# Patient Record
Sex: Female | Born: 1955 | Race: Black or African American | Hispanic: No | Smoking: Never smoker
Health system: Southern US, Community
[De-identification: ages and names within clinical notes are randomized; demographics above are authoritative.]

## PROBLEM LIST (undated history)

## (undated) DIAGNOSIS — E7800 Pure hypercholesterolemia, unspecified: Secondary | ICD-10-CM

## (undated) DIAGNOSIS — I639 Cerebral infarction, unspecified: Secondary | ICD-10-CM

## (undated) DIAGNOSIS — I10 Essential (primary) hypertension: Secondary | ICD-10-CM

---

## 2000-07-06 DIAGNOSIS — E119 Type 2 diabetes mellitus without complications: Secondary | ICD-10-CM | POA: Insufficient documentation

## 2003-03-20 ENCOUNTER — Encounter: Admission: RE | Admit: 2003-03-20 | Discharge: 2003-06-18 | Payer: Self-pay | Admitting: Family Medicine

## 2004-04-07 DIAGNOSIS — R809 Proteinuria, unspecified: Secondary | ICD-10-CM | POA: Insufficient documentation

## 2007-01-04 ENCOUNTER — Inpatient Hospital Stay (HOSPITAL_COMMUNITY): Admission: EM | Admit: 2007-01-04 | Discharge: 2007-01-10 | Payer: Self-pay | Admitting: Emergency Medicine

## 2007-01-04 DIAGNOSIS — I69959 Hemiplegia and hemiparesis following unspecified cerebrovascular disease affecting unspecified side: Secondary | ICD-10-CM | POA: Insufficient documentation

## 2007-01-05 ENCOUNTER — Encounter (INDEPENDENT_AMBULATORY_CARE_PROVIDER_SITE_OTHER): Payer: Self-pay | Admitting: Internal Medicine

## 2007-01-05 ENCOUNTER — Ambulatory Visit: Payer: Self-pay | Admitting: Vascular Surgery

## 2007-01-13 ENCOUNTER — Encounter: Admission: RE | Admit: 2007-01-13 | Discharge: 2007-04-13 | Payer: Self-pay | Admitting: Family Medicine

## 2007-01-14 ENCOUNTER — Inpatient Hospital Stay (HOSPITAL_COMMUNITY): Admission: EM | Admit: 2007-01-14 | Discharge: 2007-01-18 | Payer: Self-pay | Admitting: Emergency Medicine

## 2007-01-16 ENCOUNTER — Ambulatory Visit: Payer: Self-pay | Admitting: Physical Medicine & Rehabilitation

## 2007-01-18 ENCOUNTER — Ambulatory Visit: Payer: Self-pay | Admitting: Physical Medicine & Rehabilitation

## 2007-01-18 ENCOUNTER — Inpatient Hospital Stay (HOSPITAL_COMMUNITY)
Admission: RE | Admit: 2007-01-18 | Discharge: 2007-01-25 | Payer: Self-pay | Admitting: Physical Medicine & Rehabilitation

## 2007-01-27 ENCOUNTER — Encounter
Admission: RE | Admit: 2007-01-27 | Discharge: 2007-04-27 | Payer: Self-pay | Admitting: Physical Medicine & Rehabilitation

## 2007-03-03 ENCOUNTER — Ambulatory Visit: Payer: Self-pay | Admitting: Physical Medicine & Rehabilitation

## 2007-03-03 ENCOUNTER — Encounter
Admission: RE | Admit: 2007-03-03 | Discharge: 2007-06-01 | Payer: Self-pay | Admitting: Physical Medicine & Rehabilitation

## 2007-05-10 ENCOUNTER — Encounter
Admission: RE | Admit: 2007-05-10 | Discharge: 2007-06-29 | Payer: Self-pay | Admitting: Physical Medicine & Rehabilitation

## 2007-05-26 ENCOUNTER — Ambulatory Visit: Payer: Self-pay | Admitting: Physical Medicine & Rehabilitation

## 2007-07-05 ENCOUNTER — Encounter
Admission: RE | Admit: 2007-07-05 | Discharge: 2007-09-15 | Payer: Self-pay | Admitting: Physical Medicine & Rehabilitation

## 2007-07-05 ENCOUNTER — Ambulatory Visit: Payer: Self-pay | Admitting: Physical Medicine & Rehabilitation

## 2007-09-28 ENCOUNTER — Ambulatory Visit: Payer: Self-pay | Admitting: Physical Medicine & Rehabilitation

## 2007-09-28 ENCOUNTER — Encounter
Admission: RE | Admit: 2007-09-28 | Discharge: 2007-09-30 | Payer: Self-pay | Admitting: Physical Medicine & Rehabilitation

## 2008-03-26 ENCOUNTER — Encounter
Admission: RE | Admit: 2008-03-26 | Discharge: 2008-03-26 | Payer: Self-pay | Admitting: Physical Medicine & Rehabilitation

## 2008-05-24 ENCOUNTER — Ambulatory Visit: Payer: Self-pay | Admitting: Nurse Practitioner

## 2008-05-24 DIAGNOSIS — E7800 Pure hypercholesterolemia, unspecified: Secondary | ICD-10-CM | POA: Insufficient documentation

## 2008-05-24 DIAGNOSIS — I10 Essential (primary) hypertension: Secondary | ICD-10-CM | POA: Insufficient documentation

## 2008-05-24 LAB — CONVERTED CEMR LAB: Hgb A1c MFr Bld: 6.9 %

## 2008-05-28 LAB — CONVERTED CEMR LAB
AST: 19 units/L (ref 0–37)
Alkaline Phosphatase: 61 units/L (ref 39–117)
BUN: 16 mg/dL (ref 6–23)
CO2: 24 meq/L (ref 19–32)
Chloride: 106 meq/L (ref 96–112)
Cholesterol: 229 mg/dL — ABNORMAL HIGH (ref 0–200)
Creatinine, Ser: 0.89 mg/dL (ref 0.40–1.20)
Eosinophils Absolute: 0.2 10*3/uL (ref 0.0–0.7)
Glucose, Bld: 96 mg/dL (ref 70–99)
HCT: 37 % (ref 36.0–46.0)
Lymphocytes Relative: 40 % (ref 12–46)
Lymphs Abs: 2.5 10*3/uL (ref 0.7–4.0)
MCHC: 30.5 g/dL (ref 30.0–36.0)
Neutrophils Relative %: 51 % (ref 43–77)
Platelets: 277 10*3/uL (ref 150–400)
Potassium: 3.8 meq/L (ref 3.5–5.3)
RDW: 16 % — ABNORMAL HIGH (ref 11.5–15.5)
Total Bilirubin: 0.3 mg/dL (ref 0.3–1.2)
WBC: 6.2 10*3/uL (ref 4.0–10.5)

## 2008-05-29 ENCOUNTER — Ambulatory Visit: Payer: Self-pay | Admitting: *Deleted

## 2008-06-08 ENCOUNTER — Ambulatory Visit: Payer: Self-pay | Admitting: Nurse Practitioner

## 2008-07-03 ENCOUNTER — Encounter (INDEPENDENT_AMBULATORY_CARE_PROVIDER_SITE_OTHER): Payer: Self-pay | Admitting: Nurse Practitioner

## 2008-07-09 ENCOUNTER — Ambulatory Visit: Payer: Self-pay | Admitting: Nurse Practitioner

## 2008-07-09 LAB — CONVERTED CEMR LAB
HDL goal, serum: 40 mg/dL
LDL Goal: 70 mg/dL

## 2008-07-11 ENCOUNTER — Ambulatory Visit: Payer: Self-pay | Admitting: Internal Medicine

## 2008-07-13 ENCOUNTER — Encounter (INDEPENDENT_AMBULATORY_CARE_PROVIDER_SITE_OTHER): Payer: Self-pay | Admitting: Nurse Practitioner

## 2008-07-17 ENCOUNTER — Encounter (INDEPENDENT_AMBULATORY_CARE_PROVIDER_SITE_OTHER): Payer: Self-pay | Admitting: Nurse Practitioner

## 2008-07-20 ENCOUNTER — Encounter (INDEPENDENT_AMBULATORY_CARE_PROVIDER_SITE_OTHER): Payer: Self-pay | Admitting: Nurse Practitioner

## 2008-07-24 ENCOUNTER — Encounter (INDEPENDENT_AMBULATORY_CARE_PROVIDER_SITE_OTHER): Payer: Self-pay | Admitting: Nurse Practitioner

## 2008-07-24 DIAGNOSIS — D649 Anemia, unspecified: Secondary | ICD-10-CM | POA: Insufficient documentation

## 2008-08-20 ENCOUNTER — Telehealth (INDEPENDENT_AMBULATORY_CARE_PROVIDER_SITE_OTHER): Payer: Self-pay | Admitting: Nurse Practitioner

## 2008-09-10 ENCOUNTER — Encounter (INDEPENDENT_AMBULATORY_CARE_PROVIDER_SITE_OTHER): Payer: Self-pay | Admitting: Nurse Practitioner

## 2008-09-10 ENCOUNTER — Ambulatory Visit: Payer: Self-pay | Admitting: Nurse Practitioner

## 2008-09-10 DIAGNOSIS — H409 Unspecified glaucoma: Secondary | ICD-10-CM | POA: Insufficient documentation

## 2008-09-11 ENCOUNTER — Encounter (INDEPENDENT_AMBULATORY_CARE_PROVIDER_SITE_OTHER): Payer: Self-pay | Admitting: Nurse Practitioner

## 2008-09-14 ENCOUNTER — Ambulatory Visit (HOSPITAL_COMMUNITY): Admission: RE | Admit: 2008-09-14 | Discharge: 2008-09-14 | Payer: Self-pay | Admitting: Family Medicine

## 2008-09-14 DIAGNOSIS — A599 Trichomoniasis, unspecified: Secondary | ICD-10-CM | POA: Insufficient documentation

## 2008-12-12 ENCOUNTER — Ambulatory Visit: Payer: Self-pay | Admitting: Nurse Practitioner

## 2008-12-12 LAB — CONVERTED CEMR LAB
AST: 14 units/L (ref 0–37)
Cholesterol: 152 mg/dL (ref 0–200)
Total CHOL/HDL Ratio: 4.2
Triglycerides: 146 mg/dL (ref <150)
VLDL: 29 mg/dL (ref 0–40)

## 2008-12-13 ENCOUNTER — Encounter (INDEPENDENT_AMBULATORY_CARE_PROVIDER_SITE_OTHER): Payer: Self-pay | Admitting: Nurse Practitioner

## 2008-12-17 ENCOUNTER — Encounter (INDEPENDENT_AMBULATORY_CARE_PROVIDER_SITE_OTHER): Payer: Self-pay | Admitting: Nurse Practitioner

## 2009-01-15 ENCOUNTER — Ambulatory Visit: Payer: Self-pay | Admitting: Nurse Practitioner

## 2009-03-14 ENCOUNTER — Ambulatory Visit: Payer: Self-pay | Admitting: Nurse Practitioner

## 2009-04-25 ENCOUNTER — Ambulatory Visit: Payer: Self-pay | Admitting: Nurse Practitioner

## 2009-04-25 DIAGNOSIS — E669 Obesity, unspecified: Secondary | ICD-10-CM | POA: Insufficient documentation

## 2009-04-25 LAB — CONVERTED CEMR LAB
Bilirubin Urine: NEGATIVE
Blood Glucose, Fingerstick: 103
Microalb, Ur: 1.2 mg/dL (ref 0.00–1.89)
Specific Gravity, Urine: 1.025
Urobilinogen, UA: 0.2

## 2009-05-06 ENCOUNTER — Encounter (INDEPENDENT_AMBULATORY_CARE_PROVIDER_SITE_OTHER): Payer: Self-pay | Admitting: Nurse Practitioner

## 2009-06-25 ENCOUNTER — Ambulatory Visit: Payer: Self-pay | Admitting: Nurse Practitioner

## 2009-06-25 LAB — CONVERTED CEMR LAB
Albumin: 4.1 g/dL (ref 3.5–5.2)
Hgb A1c MFr Bld: 6.7 %
Indirect Bilirubin: 0.2 mg/dL (ref 0.0–0.9)
Triglycerides: 151 mg/dL — ABNORMAL HIGH (ref <150)

## 2009-06-27 ENCOUNTER — Encounter (INDEPENDENT_AMBULATORY_CARE_PROVIDER_SITE_OTHER): Payer: Self-pay | Admitting: Nurse Practitioner

## 2009-09-10 ENCOUNTER — Ambulatory Visit: Payer: Self-pay | Admitting: Nurse Practitioner

## 2009-09-18 ENCOUNTER — Ambulatory Visit (HOSPITAL_COMMUNITY): Admission: RE | Admit: 2009-09-18 | Discharge: 2009-09-18 | Payer: Self-pay | Admitting: Internal Medicine

## 2009-09-26 ENCOUNTER — Encounter (INDEPENDENT_AMBULATORY_CARE_PROVIDER_SITE_OTHER): Payer: Self-pay | Admitting: Nurse Practitioner

## 2010-01-10 ENCOUNTER — Ambulatory Visit: Payer: Self-pay | Admitting: Nurse Practitioner

## 2010-01-10 DIAGNOSIS — R252 Cramp and spasm: Secondary | ICD-10-CM | POA: Insufficient documentation

## 2010-01-10 LAB — CONVERTED CEMR LAB: Blood Glucose, Fingerstick: 106

## 2010-01-13 LAB — CONVERTED CEMR LAB: Hgb A1c MFr Bld: 7.5 % — ABNORMAL HIGH (ref <5.7)

## 2010-05-13 ENCOUNTER — Ambulatory Visit: Payer: Self-pay | Admitting: Nurse Practitioner

## 2010-05-13 LAB — CONVERTED CEMR LAB: Hgb A1c MFr Bld: 7.2 %

## 2010-08-03 LAB — CONVERTED CEMR LAB
Alkaline Phosphatase: 69 units/L (ref 39–117)
BUN: 24 mg/dL — ABNORMAL HIGH (ref 6–23)
Bilirubin Urine: NEGATIVE
Bilirubin Urine: NEGATIVE
Blood in Urine, dipstick: NEGATIVE
CO2: 25 meq/L (ref 19–32)
Calcium: 9.4 mg/dL (ref 8.4–10.5)
Chlamydia, DNA Probe: NEGATIVE
Chloride: 103 meq/L (ref 96–112)
Creatinine, Ser: 1.02 mg/dL (ref 0.40–1.20)
Eosinophils Relative: 1 % (ref 0–5)
Glucose, Bld: 73 mg/dL (ref 70–99)
Glucose, Urine, Semiquant: NEGATIVE
HDL: 36 mg/dL — ABNORMAL LOW (ref >39)
Hgb A1c MFr Bld: 6.5 %
KOH Prep: NEGATIVE
KOH Prep: NEGATIVE
Ketones, urine, test strip: NEGATIVE
Ketones, urine, test strip: NEGATIVE
LDL Cholesterol: 112 mg/dL — ABNORMAL HIGH (ref 0–99)
Lymphocytes Relative: 34 % (ref 12–46)
Lymphs Abs: 2.4 10*3/uL (ref 0.7–4.0)
Neutrophils Relative %: 59 % (ref 43–77)
Nitrite: NEGATIVE
OCCULT 1: NEGATIVE
Pap Smear: NEGATIVE
Platelets: 284 10*3/uL (ref 150–400)
Potassium: 4 meq/L (ref 3.5–5.3)
Potassium: 4 meq/L (ref 3.5–5.3)
Protein, U semiquant: NEGATIVE
RDW: 16.1 % — ABNORMAL HIGH (ref 11.5–15.5)
Rapid HIV Screen: NEGATIVE
Sodium: 139 meq/L (ref 135–145)
Specific Gravity, Urine: 1.015
Specific Gravity, Urine: >=1.03
TSH: 1.596 microintl units/mL (ref 0.350–4.500)
Total CHOL/HDL Ratio: 5.1
Total Protein: 7.5 g/dL (ref 6.0–8.3)
Urobilinogen, UA: 0.2
VLDL: 34 mg/dL (ref 0–40)

## 2010-08-05 NOTE — Letter (Signed)
Summary: *HSN Results Follow up  [facility]  [address] [place], [place] [address]   Phone: [phone]  Fax: [phone]      09/26/2009   RHILEY TARVER [address] [place], [place]  [address]   Dear  Ms. TIJAH HANE,                            ____S.Drinkard,FNP   ____D. Gore,FNP       ____B. McPherson,MD   ____V. Rankins,MD    ____E. Mulberry,MD    _X___N. Daphine Deutscher, FNP  ____D. Reche Dixon, MD    ____K. Philipp Deputy, MD    ____Other     This letter is to inform you that your recent test(s):  ___X____Pap Smear    _______Lab Test     _______X-ray    ___X____ is within acceptable limits  _______ requires a medication change  _______ requires a follow-up lab visit  _______ requires a follow-up visit with your provider   Comments: Pap Smear results normal.       _________________________________________________________ If you have any questions, please contact our office [phone].                    Sincerely,    Lehman Prom FNP [facility]

## 2010-08-05 NOTE — Progress Notes (Signed)
Summary: Office Visit//DEPRESSION SCREENING  Office Visit//DEPRESSION SCREENING   Imported By: Arta Bruce 11/11/2009 15:03:41  _____________________________________________________________________  External Attachment:    Type:   Image     Comment:   External Document

## 2010-08-05 NOTE — Assessment & Plan Note (Signed)
Summary: Diabetes/HTN   Vital Signs:  Patient profile:   55 year old female Menstrual status:  postmenopausal Weight:      238.1 pounds BMI:     39.16 BSA:     2.14 Temp:     98.5 degrees F oral Pulse rate:   79 / minute Pulse rhythm:   regular Resp:     20 per minute BP sitting:   135 / 83  (left arm) Cuff size:   large  Vitals Entered By: Levon Hedger (January 10, 2010 8:54 AM)  Nutrition Counseling: Patient's BMI is greater than 25 and therefore counseled on weight management options. CC: follow-up visit DM, Hypertension Management, Lipid Management Is Patient Diabetic? Yes Pain Assessment Patient in pain? no      CBG Result 106 CBG Device ID [id number]  Does patient need assistance? Functional Status Self care Ambulation Normal Comments pt has been experiencing cramps in her legs at night and in her foot   CC:  follow-up visit DM, Hypertension Management, and Lipid Management.  History of Present Illness:  Pt into the office for f/u on diabetes  Medications present today with pt   Obesity - up 5 pounds since the last visit  Diabetes Management History:      The patient is a 55 years old female who comes in for evaluation of Type 2 Diabetes Mellitus.  She has not been enrolled in the "Diabetic Education Program".  She states lack of understanding of dietary principles and is not following her diet appropriately.  Sensory loss is noted.  Self foot exams are not being performed.  She is checking home blood sugars.  She says that she is exercising.        Hypoglycemic symptoms are not occurring.  No hyperglycemic symptoms are reported.        No changes have been made to her treatment plan since last visit.    Hypertension History:      She denies headache, chest pain, and palpitations.  She notes no problems with any antihypertensive medication side effects.  Pt is taking meds as needed.        Positive major cardiovascular risk factors include female age 90 years old or  older, diabetes, hyperlipidemia, and hypertension.  Negative major cardiovascular risk factors include negative family history for ischemic heart disease and non-tobacco-user status.        Positive history for target organ damage include prior stroke (or TIA).  Further assessment for target organ damage reveals no history of ASHD, cardiac end-organ damage (CHF/LVH), peripheral vascular disease, renal insufficiency, or hypertensive retinopathy.    Lipid Management History:      Positive NCEP/ATP III risk factors include female age 70 years old or older, diabetes, HDL cholesterol less than 40, hypertension, and prior stroke (or TIA).  Negative NCEP/ATP III risk factors include no family history for ischemic heart disease, non-tobacco-user status, no ASHD (atherosclerotic heart disease), no peripheral vascular disease, and no history of aortic aneurysm.        The patient states that she knows about the "Therapeutic Lifestyle Change" diet.  Her compliance with the TLC diet is fair.  The patient does not know about adjunctive measures for cholesterol lowering.  She expresses no side effects from her lipid-lowering medication.  The patient denies any symptoms to suggest myopathy or liver disease.       Habits & Providers  Alcohol-Tobacco-Diet     Alcohol drinks/day: 0     Tobacco Status:  never  Exercise-Depression-Behavior     Does Patient Exercise: yes     Exercise Counseling: to improve exercise regimen     Depression Counseling: not indicated; screening negative for depression     Drug Use: no     Seat Belt Use: 100     Sun Exposure: occasionally  Medications Prior to Update: 1)  Aggrenox 25-200 Mg Xr12h-Cap (Aspirin-Dipyridamole) .... One Capsule By Mouth Two Times A Day For Circulation 2)  Lisinopril-Hydrochlorothiazide 20-25 Mg Tabs (Lisinopril-Hydrochlorothiazide) .... One Tablet By Mouth Daily For Blood Pressure 3)  Janumet 50-500 Mg Tabs (Sitagliptin-Metformin Hcl) .... Take One  Tablet By Mouth Twice A Day For Blood Sugar 4)  Crestor 20 Mg Tabs (Rosuvastatin Calcium) .... One Tablet By Mouth Nightly For Cholesterol 5)  Glucometer Elite Classic  Kit (Blood Glucose Monitoring Suppl) .... Dispense Glucometer, Lancets, Test Strips  Dx 250.00 Check Blood Sugar Daily Before Breakfast 6)  Blood Glucose Test  Strp (Glucose Blood) .... Use To Check Blood Sugar Once Daily 7)  Glucotrol Xl 5 Mg Xr24h-Tab (Glipizide) .... One Tablet By Mouth Daily For Blood Sugar 8)  Ferrous Sulfate 325 (65 Fe) Mg Tabs (Ferrous Sulfate) .... One Tablet By Mouth Dailly  Allergies (verified): No Known Drug Allergies  Review of Systems General:  Complains of sweats; denies fever; especially at night. CV:  Denies chest pain or discomfort. Resp:  Denies cough. GI:  Denies abdominal pain, nausea, and vomiting. MS:  Complains of cramps; bil legs - mainly at night. only in the left leg.  usually takes about 2-3 minutes before it passes. started about 1 month ago.  she has been rubbing the area with alcohol until is passes. Neuro:  Denies headaches.  Physical Exam  General:  alert.  obese Head:  normocephalic.   Mouth:  pharynx pink and moist.   Lungs:  normal breath sounds.   Heart:  normal rate and regular rhythm.   Abdomen:  normal bowel sounds.   Neurologic:  cane use  Diabetes Management Exam:    Foot Exam (with socks and/or shoes not present):       Sensory-Monofilament:          Left foot: normal          Right foot: normal       Nails:          Left foot: normal          Right foot: thickened   Impression & Recommendations:  Problem # 1:  DIABETES MELLITUS (ICD-250.00) pt is taking meds as ordered will check hbga1c (send out) Her updated medication list for this problem includes:    Lisinopril-hydrochlorothiazide 20-25 Mg Tabs (Lisinopril-hydrochlorothiazide) ..... One tablet by mouth daily for blood pressure    Janumet 50-500 Mg Tabs (Sitagliptin-metformin hcl) .Marland Kitchen... Take  one tablet by mouth twice a day for blood sugar    Glucotrol Xl 5 Mg Xr24h-tab (Glipizide) ..... One tablet by mouth daily for blood sugar  Orders: T- Hemoglobin A1C (14782-95621) Capillary Blood Glucose/CBG (30865)  Problem # 2:  HYPERTENSION, BENIGN ESSENTIAL (ICD-401.1) Assessment: Unchanged Pt is doing well continue current meds Her updated medication list for this problem includes:    Lisinopril-hydrochlorothiazide 20-25 Mg Tabs (Lisinopril-hydrochlorothiazide) ..... One tablet by mouth daily for blood pressure  Problem # 3:  HYPERCHOLESTEROLEMIA (ICD-272.0)  Her updated medication list for this problem includes:    Crestor 20 Mg Tabs (Rosuvastatin calcium) ..... 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 One capsule by mouth two times a day for circulation  Problem # 5:  OBESITY (ICD-278.00) up 5 pounds since last visit advised pt to restart on exercise routine  Problem # 6:  LEG CRAMPS, NOCTURNAL (ICD-729.82) advised pt to chose potassium rich foods   Complete Medication List: 1)  Aggrenox 25-200 Mg Xr12h-cap (Aspirin-dipyridamole) .... One capsule by mouth two times a day for circulation 2)  Lisinopril-hydrochlorothiazide 20-25 Mg Tabs (Lisinopril-hydrochlorothiazide) .... One tablet by mouth daily for blood pressure 3)  Janumet 50-500 Mg Tabs (Sitagliptin-metformin hcl) .... Take one tablet by mouth twice a day for blood sugar 4)  Crestor 20 Mg Tabs (Rosuvastatin calcium) .... One tablet by mouth nightly for cholesterol 5)  Glucometer Elite Classic Kit (Blood glucose monitoring suppl) .... Dispense glucometer, lancets, test strips  dx 250.00 check blood sugar daily before breakfast 6)  Blood Glucose Test Strp (Glucose blood) .... Use to check blood sugar once daily 7)  Glucotrol Xl 5 Mg Xr24h-tab  (Glipizide) .... One tablet by mouth daily for blood sugar 8)  Ferrous Sulfate 325 (65 Fe) Mg Tabs (Ferrous sulfate) .... One tablet by mouth dailly  Diabetes Management Assessment/Plan:      The following lipid goals have been established for the patient: Total cholesterol goal of 200; LDL cholesterol goal of 70; HDL cholesterol goal of 40; Triglyceride goal of 150.  Her blood pressure goal is < 130/80.    Hypertension Assessment/Plan:      The patient's hypertensive risk group is category C: Target organ damage and/or diabetes.  Her calculated 10 year risk of coronary heart disease is 20 %.  Today's blood pressure is 135/83.  Her blood pressure goal is < 130/80.  Lipid Assessment/Plan:      Based on NCEP/ATP III, the patient's risk factor category is "history of coronary disease, peripheral vascular disease, cerebrovascular disease, or aortic aneurysm along with either diabetes, current smoker, or LDL > 130 plus HDL < 40 plus triglycerides > 200".  The patient's lipid goals are as follows: Total cholesterol goal is 200; LDL cholesterol goal is 70; HDL cholesterol goal is 40; Triglyceride goal is 150.    Patient Instructions: 1)  Diabetes - you will be notified of your hgba1c results 2)  Hopefully your diabetes is still doing good. 3)  leg cramps - may be due to sweating since it has been so hot 4)  increase potassium rich foods but only the ones that will not raise your blood sugar 5)  Follow up in 4 months with n.martin,fnp for diabetes 6)  Will need flu vaccine at that time.  Last LDL:                                                 105 (06/25/2009 9:15:00 PM)        Diabetic Foot Exam Last Podiatry Exam Date: 12/12/2008    10-g (5.07) Semmes-Weinstein Monofilament Test Performed by: Levon Hedger          Right Foot          Left Foot Visual Inspection               Test Control      normal  normal Site 1         abnormal         abnormal Site 2         normal          normal Site 3         normal         normal Site 4         abnormal         normal Site 5         abnormal         abnormal Site 6         normal         normal Site 7         normal         normal Site 8         abnormal         abnormal Site 9         abnormal         abnormal Site 10         normal         normal  Impression      normal         normal

## 2010-08-05 NOTE — Letter (Signed)
Summary: DIABETES FLOW SHEET//BROUGHT IN MY PT  DIABETES FLOW SHEET//BROUGHT IN MY PT   Imported By: Arta Bruce 08/02/2009 12:56:17  _____________________________________________________________________  External Attachment:    Type:   Image     Comment:   External Document

## 2010-08-05 NOTE — Assessment & Plan Note (Signed)
Summary: Complete Physical Exam   Vital Signs:  Patient profile:   55 year old female Menstrual status:  postmenopausal Weight:      233.1 pounds BMI:     38.34 BSA:     2.12 Temp:     97.9 degrees F oral Pulse rate:   73 / minute Pulse rhythm:   regular Resp:     16 per minute BP sitting:   123 / 80  (left arm) Cuff size:   large  Vitals Entered By: Levon Hedger (September 10, 2009 9:03 AM) CC: CPP, Lipid Management, Hypertension Management Is Patient Diabetic? Yes Pain Assessment Patient in pain? yes     Location: knee, shoulder CBG Result 79  Does patient need assistance? Functional Status Self care Ambulation Normal     Menstrual Status postmenopausal Last PAP Result NEGATIVE FOR INTRAEPITHELIAL LESIONS OR MALIGNANCY.   CC:  CPP, Lipid Management, and Hypertension Management.  History of Present Illness:  Pt into the office for a complete physical exam  PAP - last done 1 year ago in this office.   Post menopausal - last Menstrual period about 3 year ago.  Mammogram - last done in 11-04-2008. Younger sister deceased from breast cancer No self breast checks at home  Optho - last eye exam was done by Dr. Mitzi Davenport. No evidence of Glaucoma  Dental - no recent dental exam. Pt is aware that she needs to make an appt for dental exam  Social - pt is married.    Diabetes Management History:      The patient is a 55 years old female who comes in for evaluation of Type 2 Diabetes Mellitus.  She has not been enrolled in the "Diabetic Education Program".  She states understanding of dietary principles and is following her diet appropriately.  No sensory loss is reported.  Self foot exams are not being performed.  She is checking home blood sugars.  She says that she is exercising.        Hypoglycemic symptoms are not occurring.  No hyperglycemic symptoms are reported.    Hypertension History:      She denies headache, chest pain, and palpitations.  She notes no  problems with any antihypertensive medication side effects.        Positive major cardiovascular risk factors include female age 67 years old or older, diabetes, hyperlipidemia, and hypertension.  Negative major cardiovascular risk factors include negative family history for ischemic heart disease and non-tobacco-user status.        Positive history for target organ damage include prior stroke (or TIA).  Further assessment for target organ damage reveals no history of ASHD, cardiac end-organ damage (CHF/LVH), peripheral vascular disease, renal insufficiency, or hypertensive retinopathy.    Lipid Management History:      Positive NCEP/ATP III risk factors include female age 22 years old or older, diabetes, HDL cholesterol less than 40, hypertension, and prior stroke (or TIA).  Negative NCEP/ATP III risk factors include no family history for ischemic heart disease, non-tobacco-user status, no ASHD (atherosclerotic heart disease), no peripheral vascular disease, and no history of aortic aneurysm.        The patient states that she knows about the "Therapeutic Lifestyle Change" diet.  Her compliance with the TLC diet is fair.  The patient expresses understanding of adjunctive measures for cholesterol lowering.  She expresses no side effects from her lipid-lowering medication.  The patient denies any symptoms to suggest myopathy or liver disease.  Diabetic Foot Exam Last Podiatry Exam Date: 12/12/2008 Foot Inspection Is there a history of a foot ulcer?              No Is there a foot ulcer now?              No Are the shoes appropriate in style and fit?          Yes Is there swelling or an abnormal foot shape?          No Are the toenails long?                No Are the toenails thick?                Yes Are the toenails ingrown?              No Is there heavy callous build-up?              No Is there pain in the calf muscle (Intermittent claudication) when walking?    NoIs there a claw toe  deformity?              No Is there elevated skin temperature?            No Is there limited ankle dorsiflexion?            No Is there foot or ankle muscle weakness?            No  Diabetic Foot Care Education Patient educated on appropriate care of diabetic feet.  Pulse Check          Right Foot          Left Foot Dorsalis Pedis:        normal            Comments: diabetes shoes   10-g (5.07) Semmes-Weinstein Monofilament Test           Right Foot          Left Foot Visual Inspection               Test Control      normal         normal Site 1         normal         normal Site 2         normal         normal Site 3         normal         normal Site 4         normal         normal Site 5         normal         normal Site 6         normal         normal Site 7         normal         normal Site 8         normal         normal Site 9         abnormal         normal Site 10         normal         normal  Impression      normal         normal   Habits & Providers  Alcohol-Tobacco-Diet     Alcohol drinks/day: 0     Tobacco Status: never  Exercise-Depression-Behavior     Does Patient Exercise: yes     Exercise Counseling: to improve exercise regimen     Have you felt down or hopeless? no     Have you felt little pleasure in things? no     Depression Counseling: not indicated; screening negative for depression     Drug Use: no     Seat Belt Use: 100     Sun Exposure: occasionally  Comments: PHQ-9 SCORE = 18  Medications Prior to Update: 1)  Aggrenox 25-200 Mg Xr12h-Cap (Aspirin-Dipyridamole) .... One Capsule By Mouth Two Times A Day For Circulation 2)  Lisinopril-Hydrochlorothiazide 20-25 Mg Tabs (Lisinopril-Hydrochlorothiazide) .... One Tablet By Mouth Daily For Blood Pressure 3)  Janumet 50-500 Mg Tabs (Sitagliptin-Metformin Hcl) .... Take One Tablet By Mouth Twice A Day For Blood Sugar 4)  Crestor 20 Mg Tabs (Rosuvastatin Calcium) .... One Tablet By Mouth Nightly  For Cholesterol 5)  Glucometer Elite Classic  Kit (Blood Glucose Monitoring Suppl) .... Dispense Glucometer, Lancets, Test Strips  Dx 250.00 Check Blood Sugar Daily Before Breakfast 6)  Blood Glucose Test  Strp (Glucose Blood) .... Use To Check Blood Sugar Once Daily 7)  Glucotrol Xl 5 Mg Xr24h-Tab (Glipizide) .... One Tablet By Mouth Daily For Blood Sugar  Current Medications (verified): 1)  Aggrenox 25-200 Mg Xr12h-Cap (Aspirin-Dipyridamole) .... One Capsule By Mouth Two Times A Day For Circulation 2)  Lisinopril-Hydrochlorothiazide 20-25 Mg Tabs (Lisinopril-Hydrochlorothiazide) .... One Tablet By Mouth Daily For Blood Pressure 3)  Janumet 50-500 Mg Tabs (Sitagliptin-Metformin Hcl) .... Take One Tablet By Mouth Twice A Day For Blood Sugar 4)  Crestor 20 Mg Tabs (Rosuvastatin Calcium) .... One Tablet By Mouth Nightly For Cholesterol 5)  Glucometer Elite Classic  Kit (Blood Glucose Monitoring Suppl) .... Dispense Glucometer, Lancets, Test Strips  Dx 250.00 Check Blood Sugar Daily Before Breakfast 6)  Blood Glucose Test  Strp (Glucose Blood) .... Use To Check Blood Sugar Once Daily 7)  Glucotrol Xl 5 Mg Xr24h-Tab (Glipizide) .... One Tablet By Mouth Daily For Blood Sugar  Allergies (verified): No Known Drug Allergies  Review of Systems General:  Denies fever. Eyes:  Denies blurring; wears reading glasses.  Last eye exam done 01/2009 by Dr. Mitzi Davenport. ENT:  Denies earache. CV:  Denies chest pain or discomfort. Resp:  Denies cough. GI:  Denies abdominal pain, nausea, and vomiting. GU:  Denies discharge. MS:  Denies joint pain. Derm:  Denies rash. Neuro:  Denies headaches. Psych:  Denies anxiety and depression. Endo:  Denies excessive thirst and excessive urination.  Physical Exam  General:  alert.   Head:  normocephalic.   Eyes:  pupils equal and pupils round.   Ears:  TM with some clear fluid Nose:  no nasal discharge.   Mouth:  fair dentition.  some missing teeth Neck:  supple.    Chest Wall:  no mass.   Breasts:  skin/areolae normal, no masses, and no abnormal thickening.   Lungs:  normal breath sounds.   Heart:  normal rate and regular rhythm.   Abdomen:  soft, non-tender, and normal bowel sounds.   Rectal:  external hemorrhoid(s).  guaiac negative Msk:  up to the exam table atttempt x 1 Psych:  cane   Pelvic Exam  Vulva:      normal appearance.   Urethra and Bladder:      Urethra--normal.   Vagina:  physiologic discharge.   Cervix:      midposition.   Adnexa:      nontender bilaterally.   Rectum:      heme negative stool.    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 Her updated medication list for this problem includes:    Lisinopril-hydrochlorothiazide 20-25 Mg Tabs (Lisinopril-hydrochlorothiazide) ..... One tablet by mouth daily for blood pressure  Orders: T-Basic Metabolic Panel [id number]) T-CBC w/Diff [id number]) EKG w/ Interpretation (93000)  Problem # 4:  DIABETES MELLITUS (ICD-250.00) Stable continue current meds Her updated medication list for this problem includes:     Lisinopril-hydrochlorothiazide 20-25 Mg Tabs (Lisinopril-hydrochlorothiazide) ..... One tablet by mouth daily for blood pressure    Janumet 50-500 Mg Tabs (Sitagliptin-metformin hcl) .Marland Kitchen... Take one tablet by mouth twice a day for blood sugar    Glucotrol Xl 5 Mg Xr24h-tab (Glipizide) ..... One tablet by mouth daily for blood sugar  Orders: UA Dipstick w/o Micro (manual) (13244)  Problem # 5:  HYPERCHOLESTEROLEMIA (ICD-272.0)  Her updated medication list for this problem includes:    Crestor 20 Mg Tabs (Rosuvastatin calcium) ..... One tablet by mouth nightly for cholesterol  Complete Medication List: 1)  Aggrenox 25-200 Mg Xr12h-cap (Aspirin-dipyridamole) .... One capsule by mouth two times a day for circulation 2)  Lisinopril-hydrochlorothiazide 20-25 Mg Tabs (Lisinopril-hydrochlorothiazide) .... One tablet by mouth daily for blood pressure 3)  Janumet 50-500 Mg Tabs (Sitagliptin-metformin hcl) .... Take one tablet by mouth twice a day for blood sugar 4)  Crestor 20 Mg Tabs (Rosuvastatin calcium) .... One tablet by mouth nightly for cholesterol 5)  Glucometer Elite Classic Kit (Blood glucose monitoring suppl) .... Dispense glucometer, lancets, test strips  dx 250.00 check blood sugar daily before breakfast 6)  Blood Glucose Test Strp (Glucose blood) .... Use to check blood sugar once daily 7)  Glucotrol Xl 5 Mg Xr24h-tab (Glipizide) .... One tablet by mouth daily for blood sugar  Other Orders: T-TSH ([id number])  Diabetes Management Assessment/Plan:      The following lipid goals have been established for the patient: Total cholesterol goal of 200; LDL cholesterol goal of 70; HDL cholesterol goal of 40; Triglyceride goal of 150.  Her blood pressure goal is < 130/80.    Hypertension Assessment/Plan:      The patient's hypertensive risk group is category C: Target organ damage and/or diabetes.  Her calculated 10 year risk of coronary heart disease is 20 %.  Today's blood pressure is  123/80.  Her blood pressure goal is < 130/80.  Lipid Assessment/Plan:      Based on NCEP/ATP III, the patient's risk factor category is "history of coronary disease, peripheral vascular disease, cerebrovascular disease, or aortic aneurysm along with either diabetes, current smoker, or LDL > 130 plus HDL < 40 plus triglycerides > 200".  The patient's lipid goals are as follows: Total cholesterol goal is 200; LDL cholesterol goal is 70; HDL cholesterol goal is 40; Triglyceride goal is 150.     Patient Instructions: 1)  You will be notified of any abnormal labs 2)  Continue medications as ordered 3)  Keep appointment for mammogram 4)  Follow up in 4 months - July 2011 for diabetes and high blood pressure or sooner if necessary. Prescriptions: BLOOD GLUCOSE TEST  STRP (GLUCOSE BLOOD) Use to check blood sugar once daily  #100 x 6   Entered and Authorized by:   Lehman Prom FNP   Signed by:   Lehman Prom FNP on 09/10/2009   Method used:   Faxed to ...       [facility] (retail)       [address] [place], [place]  [address]       Ph: [phone]       Fax: [phone]   RxID:   [id number] CRESTOR 20 MG TABS (ROSUVASTATIN CALCIUM) One tablet by mouth nightly for cholesterol  #30 x 6   Entered and Authorized by:   Lehman Prom FNP   Signed by:   Lehman Prom FNP on 09/10/2009   Method used:   Faxed to ...       [facility] (retail)       [address] [place], [place]  [address]       Ph: [phone]       Fax: [phone]   RxID:   [id number] GLUCOTROL XL 5 MG XR24H-TAB (GLIPIZIDE) One tablet by mouth daily for blood sugar  #30 x 6   Entered and Authorized by:   Lehman Prom FNP   Signed by:   Lehman Prom FNP on 09/10/2009   Method used:   Faxed to ...       [facility] (retail)       [address] [place], [place]   [address]       Ph: [phone]       Fax: [phone]   RxID:   [id number]   Laboratory Results   Urine Tests  Date/Time Received: September 10, 2009 10:27 AM   Routine Urinalysis   Color: lt. yellow Glucose: negative   (Normal Range: Negative) Bilirubin: negative   (Normal Range: Negative) Ketone: negative   (Normal Range: Negative) Spec. Gravity: >=1.030   (Normal Range: 1.003-1.035) Blood: negative   (Normal Range: Negative) pH: 5.0   (Normal Range: 5.0-8.0) Protein: negative   (Normal Range: Negative) Urobilinogen: 0.2   (Normal Range: 0-1) Nitrite: negative   (Normal Range: Negative) Leukocyte Esterace: trace   (Normal Range: Negative)     Blood Tests   Date/Time Received: September 10, 2009 10:27 AM   HGBA1C: 6.5%   (Normal Range: Non-Diabetic - 3-6%   Control Diabetic - 6-8%) CBG Random:: 79mg /dL  Date/Time Received: September 10, 2009 10:28 AM   Wet [place] Source: vaginal WBC/hpf: 1-5 Bacteria/hpf: rare Clue cells/hpf: none Yeast/hpf:  none Wet [place] KOH: Negative Trichomonas/hpf: none  Other Tests  Rapid HIV: negative  Stool - Occult Blood Hemmoccult #1: negative Date: 09/10/2009

## 2010-08-05 NOTE — Letter (Signed)
Summary: TEST ORDER FORM/MAMMOGRAM//APPT DATE & TIME  TEST ORDER FORM/MAMMOGRAM//APPT DATE & TIME   Imported By: Arta Bruce 11/01/2009 15:14:07  _____________________________________________________________________  External Attachment:    Type:   Image     Comment:   External Document

## 2010-08-05 NOTE — Assessment & Plan Note (Signed)
Summary: Diabetes/HTN   Vital Signs:  Patient profile:   55 year old female Menstrual status:  postmenopausal Weight:      236. pounds BMI:     38.81 Temp:     97.5 degrees F oral Pulse rate:   80 / minute Pulse rhythm:   regular Resp:     20 per minute BP sitting:   118 / 80  (right arm) Cuff size:   large  Vitals Entered By: Levon Hedger (May 13, 2010 9:01 AM)  Nutrition Counseling: Patient's BMI is greater than 25 and therefore counseled on weight management options. CC: follow-up visit DM, Hypertension Management, Lipid Management Is Patient Diabetic? Yes Pain Assessment Patient in pain? no       Does patient need assistance? Functional Status Self care Ambulation Normal, Impaired:Risk for fall   CC:  follow-up visit DM, Hypertension Management, and Lipid Management.  History of Present Illness:  Pt into the office to diabetes and htn.  Pt presents today with all her medications. Congrats to pt for bring meds.  Obesity - down 2 pounds since last visit.  She is trying to walk with her husband.  She also has a treadmill but her affected side "hits" the padding so hard that it makes it uncomfortable  Diabetes Management History:      The patient is a 55 years old female who comes in for evaluation of Type 2 Diabetes Mellitus.  She has not been enrolled in the "Diabetic Education Program".  She states understanding of dietary principles and is following her diet appropriately.  No sensory loss is reported.  Self foot exams are not being performed.  She is checking home blood sugars.  She says that she is exercising.        Hypoglycemic symptoms are not occurring.  No hyperglycemic symptoms are reported.        No changes have been made to her treatment plan since last visit.    Hypertension History:      She denies headache, chest pain, and palpitations.  She notes no problems with any antihypertensive medication side effects.  Pt is taking meds as ordered.        Positive major cardiovascular risk factors include female age 48 years old or older, diabetes, hyperlipidemia, and hypertension.  Negative major cardiovascular risk factors include negative family history for ischemic heart disease and non-tobacco-user status.        Positive history for target organ damage include prior stroke (or TIA).  Further assessment for target organ damage reveals no history of ASHD, cardiac end-organ damage (CHF/LVH), peripheral vascular disease, renal insufficiency, or hypertensive retinopathy.    Lipid Management History:      Positive NCEP/ATP III risk factors include female age 41 years old or older, diabetes, HDL cholesterol less than 40, hypertension, and prior stroke (or TIA).  Negative NCEP/ATP III risk factors include no family history for ischemic heart disease, non-tobacco-user status, no ASHD (atherosclerotic heart disease), no peripheral vascular disease, and no history of aortic aneurysm.        The patient states that she knows about the "Therapeutic Lifestyle Change" diet.  Her compliance with the TLC diet is fair.  She expresses no side effects from her lipid-lowering medication.  Comments include: pt it taking meds as ordered.  The patient denies any symptoms to suggest myopathy or liver disease.      Medications Prior to Update: 1)  Aggrenox 25-200 Mg Xr12h-Cap (Aspirin-Dipyridamole) .... One Capsule By  Mouth Two Times A Day For Circulation 2)  Lisinopril-Hydrochlorothiazide 20-25 Mg Tabs (Lisinopril-Hydrochlorothiazide) .... One Tablet By Mouth Daily For Blood Pressure 3)  Janumet 50-500 Mg Tabs (Sitagliptin-Metformin Hcl) .... Take One Tablet By Mouth Twice A Day For Blood Sugar 4)  Crestor 20 Mg Tabs (Rosuvastatin Calcium) .... One Tablet By Mouth Nightly For Cholesterol 5)  Glucometer Elite Classic  Kit (Blood Glucose Monitoring Suppl) .... Dispense Glucometer, Lancets, Test Strips  Dx 250.00 Check Blood Sugar Daily Before Breakfast 6)  Blood  Glucose Test  Strp (Glucose Blood) .... Use To Check Blood Sugar Once Daily 7)  Glucotrol Xl 5 Mg Xr24h-Tab (Glipizide) .... One Tablet By Mouth Daily For Blood Sugar 8)  Ferrous Sulfate 325 (65 Fe) Mg Tabs (Ferrous Sulfate) .... One Tablet By Mouth Dailly  Current Medications (verified): 1)  Aggrenox 25-200 Mg Xr12h-Cap (Aspirin-Dipyridamole) .... One Capsule By Mouth Two Times A Day For Circulation 2)  Lisinopril-Hydrochlorothiazide 20-25 Mg Tabs (Lisinopril-Hydrochlorothiazide) .... One Tablet By Mouth Daily For Blood Pressure 3)  Janumet 50-500 Mg Tabs (Sitagliptin-Metformin Hcl) .... Take One Tablet By Mouth Twice A Day For Blood Sugar 4)  Crestor 20 Mg Tabs (Rosuvastatin Calcium) .... One Tablet By Mouth Nightly For Cholesterol 5)  Glucometer Elite Classic  Kit (Blood Glucose Monitoring Suppl) .... Dispense Glucometer, Lancets, Test Strips  Dx 250.00 Check Blood Sugar Daily Before Breakfast 6)  Blood Glucose Test  Strp (Glucose Blood) .... Use To Check Blood Sugar Once Daily 7)  Glucotrol Xl 5 Mg Xr24h-Tab (Glipizide) .... One Tablet By Mouth Daily For Blood Sugar 8)  Ferrous Sulfate 325 (65 Fe) Mg Tabs (Ferrous Sulfate) .... One Tablet By Mouth Dailly  Allergies (verified): No Known Drug Allergies  Review of Systems General:  Denies fever. CV:  Denies chest pain or discomfort. Resp:  Denies cough. GI:  Denies abdominal pain, nausea, and vomiting. MS:  Complains of cramps; denies joint pain; right leg - usually at night.  Physical Exam  General:  alert.   Head:  normocephalic.   Lungs:  normal breath sounds.   Heart:  normal rate and regular rhythm.   Abdomen:  normal bowel sounds.   Msk:  right side - hemiparesis functional Neurologic:  cane use  Diabetes Management Exam:    Foot Exam (with socks and/or shoes not present):       Sensory-Monofilament:          Left foot: normal          Right foot: abnormal   Impression & Recommendations:  Problem # 1:  DIABETES  MELLITUS (ICD-250.00) Advised pt to check BS daily before breakfast Continue current meds rec optho exam Her updated medication list for this problem includes:    Lisinopril-hydrochlorothiazide 20-25 Mg Tabs (Lisinopril-hydrochlorothiazide) ..... One tablet by mouth daily for blood pressure    Janumet 50-500 Mg Tabs (Sitagliptin-metformin hcl) .Marland Kitchen... Take one tablet by mouth twice a day for blood sugar    Glucotrol Xl 5 Mg Xr24h-tab (Glipizide) ..... One tablet by mouth daily for blood sugar  Orders: Hemoglobin A1C (83036) Capillary Blood Glucose/CBG (18299)  Problem # 2:  HYPERTENSION, BENIGN ESSENTIAL (ICD-401.1) BP is stable continue current meds Her updated medication list for this problem includes:    Lisinopril-hydrochlorothiazide 20-25 Mg Tabs (Lisinopril-hydrochlorothiazide) ..... 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 One capsule by mouth two times a day for circulation  Complete Medication List: 1)  Aggrenox 25-200 Mg Xr12h-cap (Aspirin-dipyridamole) .... One capsule by mouth two times a day for circulation 2)  Lisinopril-hydrochlorothiazide 20-25 Mg Tabs (Lisinopril-hydrochlorothiazide) .... One tablet by mouth daily for blood pressure 3)  Janumet 50-500 Mg Tabs (Sitagliptin-metformin hcl) .... Take one tablet by mouth twice a day for blood sugar 4)  Crestor 20 Mg Tabs (Rosuvastatin calcium) .... One tablet  by mouth nightly for cholesterol 5)  Glucometer Elite Classic Kit (Blood glucose monitoring suppl) .... Dispense glucometer, lancets, test strips  dx 250.00 check blood sugar daily before breakfast 6)  Blood Glucose Test Strp (Glucose blood) .... Use to check blood sugar once daily 7)  Glucotrol Xl 5 Mg Xr24h-tab (Glipizide) .... One tablet by mouth daily for blood sugar 8)  Ferrous Sulfate 325 (65 Fe) Mg Tabs (Ferrous sulfate) .... One tablet by mouth dailly 9)  Gabapentin 300 Mg Caps (Gabapentin) .... One capsule by mouth nightly for cramps and leg pain  Other Orders: Flu Vaccine 22yrs + (16109) Admin 1st Vaccine ([address])  Diabetes Management Assessment/Plan:      The following lipid goals have been established for the patient: Total cholesterol goal of 200; LDL cholesterol goal of 70; HDL cholesterol goal of 40; Triglyceride goal of 150.  Her blood pressure goal is < 130/80.    Hypertension Assessment/Plan:      The patient's hypertensive risk group is category C: Target organ damage and/or diabetes.  Her calculated 10 year risk of coronary heart disease is 20 %.  Today's blood pressure is 118/80.  Her blood pressure goal is < 130/80.  Lipid Assessment/Plan:      Based on NCEP/ATP III, the patient's risk factor category is "history of coronary disease, peripheral vascular disease, cerebrovascular disease, or aortic aneurysm along with either diabetes, current smoker, or LDL > 130 plus HDL < 40 plus triglycerides > 200".  The patient's lipid goals are as follows: Total cholesterol goal is 200; LDL cholesterol goal is 70; HDL cholesterol goal is 40; Triglyceride goal is 150.    Patient Instructions: 1)  You have been given the flu vaccine today. 2)  REMEMBER - no walking barefooted.  You have lost sensation in your feet and are at high risk for foot ulcers. 3)  May take gabapentin 300mg  by mouth nightly as needed for cramps and leg pain (prescription has been sent to the pharmacy) 4)  Eye -  free eye exam this weekend 5)  Diabetes - Hgba1c = 7.5 (remember the goal is less than 7) 6)  Be sure to monitor your diet and keep exercising.  No change in medication at this time.  Be sure to eat in moderation for the holidays. 7)  Follow up in March for a complete physical exam. 8)  Come fasting for labs. 9)  Will need PAP, mammogram, EKG, u/a, microalbumin Prescriptions: AGGRENOX 25-200 MG XR12H-CAP (ASPIRIN-DIPYRIDAMOLE) One capsule by mouth two times a day for circulation  #60 x 5   Entered and Authorized by:   Lehman Prom FNP   Signed by:  Lehman Prom FNP on 05/13/2010   Method used:   Faxed to ...       [facility] (retail)       [address] [place], [place]  [address]       Ph: [phone]       Fax: [phone]   RxID:   [id number] GABAPENTIN 300 MG CAPS (GABAPENTIN) One capsule by mouth nightly for cramps and leg pain  #30 x 1   Entered and Authorized by:   Lehman Prom FNP   Signed by:   Lehman Prom FNP on 05/13/2010   Method used:   Faxed to ...       [facility] (retail)       [address] [place], [place]  [address]       Ph: [phone]       Fax: [phone]   RxID:   [id number] JANUMET 50-500 MG TABS (SITAGLIPTIN-METFORMIN HCL) Take one tablet by mouth twice a day for blood sugar  #60 x 6   Entered and Authorized by:   Lehman Prom FNP   Signed by:   Lehman Prom FNP on 05/13/2010   Method used:   Faxed to ...       [facility] (retail)       [address] [place], [place]  [address]       Ph: [phone]       Fax: [phone]   RxID:   [id number]    Orders Added: 1)  Flu Vaccine 65yrs + [90658] 2)  Admin 1st Vaccine [90471] 3)  Est. Patient Level IV [71062] 4)  Hemoglobin A1C [83036] 5)  Capillary Blood Glucose/CBG [69485]   Immunizations Administered:  Influenza  Vaccine # 1:    Vaccine Type: Fluvax 3+    Site: left deltoid    Mfr: GlaxoSmithKline    Dose: 0.5 ml    Route: IM    Given by: Levon Hedger    Exp. Date: 01/03/2011    Lot #: [id number]    VIS given: 01/28/10 version given May 13, 2010.  Flu Vaccine Consent Questions:    Do you have a history of severe allergic reactions to this vaccine? no    Any prior history of allergic reactions to egg and/or gelatin? no    Do you have a sensitivity to the preservative Thimersol? no    Do you have a past history of Guillan-Barre Syndrome? no    Do you currently have an acute febrile illness? no    Have you ever had a severe reaction to latex? no    Vaccine information given and explained to patient? yes    Are you currently pregnant? no    ndc  [phone]  Immunizations Administered:  Influenza Vaccine # 1:    Vaccine Type: Fluvax 3+    Site: left deltoid    Mfr: GlaxoSmithKline    Dose: 0.5 ml    Route: IM    Given by: Levon Hedger    Exp. Date: 01/03/2011    Lot #: [id number]    VIS given: 01/28/10 version given May 13, 2010.  Diabetic Foot Exam Last Podiatry Exam Date: 12/12/2008 Foot Inspection Is there a history of a foot ulcer?              No Is  there a foot ulcer now?              No Can the patient see the bottom of their feet?          No Are the shoes appropriate in style and fit?          Yes Is there swelling or an abnormal foot shape?          No Are the toenails long?                Yes Are the toenails thick?                Yes Are the toenails ingrown?              No Is there heavy callous build-up?              No Is there pain in the calf muscle (Intermittent claudication) when walking?    NoIs there a claw toe deformity?              No Is there elevated skin temperature?            No Is there limited ankle dorsiflexion?            No Is there foot or ankle muscle weakness?            No  Diabetic Foot Care Education Patient educated on  appropriate care of diabetic feet.  Pulse Check          Right Foot          Left Foot Dorsalis Pedis:        normal            normal  High Risk Feet? Yes   10-g (5.07) Semmes-Weinstein Monofilament Test Performed by: Levon Hedger          Right Foot          Left Foot Visual Inspection                 Last LDL:                                                 105 (06/25/2009 9:15:00 PM)          Diabetic Foot Exam Last Podiatry Exam Date: 12/12/2008 Diabetic Foot Care Education :Patient educated on appropriate care of diabetic feet.  Pulse Check          Right Foot          Left Foot Dorsalis Pedis:        normal            normal  High Risk Feet? Yes   10-g (5.07) Semmes-Weinstein Monofilament Test Performed by: Levon Hedger          Right Foot          Left Foot Visual Inspection               Test Control      normal         normal Site 1         normal         normal Site 2         abnormal         normal Site 3  abnormal         normal Site 4         abnormal         normal Site 5         abnormal         normal Site 6         abnormal         normal Site 7         abnormal         normal Site 8         abnormal         normal Site 9         abnormal         abnormal Site 10         abnormal         normal  Impression      abnormal         normal      Laboratory Results   Blood Tests   Date/Time Received: May 13, 2010 9:12 AM   HGBA1C: 7.2%   (Normal Range: Non-Diabetic - 3-6%   Control Diabetic - 6-8%) CBG Fasting:: 94     Prevention & Chronic Care Immunizations   Influenza vaccine: Fluvax 3+  (05/13/2010)    Tetanus booster: 09/10/2008: Tdap    Pneumococcal vaccine: Pneumovax  (12/12/2008)  Colorectal Screening   Hemoccult: Not documented   Hemoccult action/deferral: NEG X 1 today; Given X 3  (09/10/2008)    Colonoscopy: Not documented  Other Screening   Pap smear:  Specimen Adequacy: Satisfactory for evaluation.    Interpretation/Result:Negative for intraepithelial Lesion or Malignancy.     (09/10/2009)   Pap smear action/deferral: PAP smear done  (09/10/2008)   Pap smear due: 09/2010    Mammogram: ASSESSMENT: Negative - BI-RADS 1^MM DIGITAL SCREENING  (09/18/2009)   Mammogram action/deferral: mammogram ordered  (09/10/2008)   Smoking status: never  (01/10/2010)  Diabetes Mellitus   HgbA1C: 7.2  (05/13/2010)    Eye exam: No evidence of glaucoma  (01/03/2009)    Foot exam: yes  (05/13/2010)   High risk foot: Yes  (05/13/2010)   Foot care education: Done  (05/13/2010)    Urine microalbumin/creatinine ratio: Not documented  Lipids   Total Cholesterol: 170  (06/25/2009)   LDL: 105  (06/25/2009)   LDL Direct: Not documented   HDL: 35  (06/25/2009)   Triglycerides: 151  (06/25/2009)    SGOT (AST): 14  (06/25/2009)   SGPT (ALT): 11  (06/25/2009)   Alkaline phosphatase: 57  (06/25/2009)   Total bilirubin: 0.3  (06/25/2009)  Hypertension   Last Blood Pressure: 118 / 80  (05/13/2010)   Serum creatinine: 1.02  (09/10/2009)   Serum potassium 4.0  (09/10/2009)  Self-Management Support :    Diabetes self-management support: Not documented    Hypertension self-management support: Not documented    Lipid self-management support: Not documented    Nursing Instructions: Give Flu vaccine today    Laboratory Results   Blood Tests     HGBA1C: 7.2%   (Normal Range: Non-Diabetic - 3-6%   Control Diabetic - 6-8%) CBG Fasting:: 94mg /dL

## 2010-11-18 NOTE — Assessment & Plan Note (Signed)
[name] is back regarding her left basal ganglia stroke.  Baclofen has  helped a bit with her tone but she could not tolerate the t.i.d. dosing  as her spasms were a bit worse.  She has continued to have some right  shoulder pain which is helped by heat to a certain extent.  Denies any  other issues today.  She has been active walking with her husband and  can walk 20 minutes + at a time.  She rates her pain as a 5 out 10.  Describes it as intermittent, sharp and aching.  Swelling is a bit  better in the right upper extremity.  Pain interferes with her general  activity, relations with others, and enjoyment of life on a moderate  level.  Sleep is fair.   REVIEW OF SYSTEMS:  Notable for spasms, depression, high sugars at  times, diarrhea, limb swelling and coughing.  Full review of systems is  in the written health and history section of the chart.   SOCIAL HISTORY:  Patient is married, living with her husband who is very  supportive.   PHYSICAL EXAMINATION:  VITAL SIGNS:  Blood pressure 125/62, pulse 68,  respiratory rate 18.  She is satting 97% on room air.  GENERAL:  Patient is pleasant.  Alert and oriented x3.  Affect is bright  and appropriate.  EXTREMITIES:  Strength remains 2+ to 3/5 in the right upper extremity  with continued tone at trace to 1 out of 5 at the elbow and wrist today.  She has some tightness of the right shoulder which appears to be more  adhesive capsulitis than anything else.  There is pain notable with  abduction as well as impingement maneuvers on the right side.  Sensation  was grossly intact at 1+/2.  Mild central seven is minimally present  today.  Cognitively, she is appropriate.  HEART:  Regular rate.  CHEST:  Clear.  ABDOMEN:  Soft, nontender.   ASSESSMENT:  1. Left basal ganglia stroke.  2. Hypertension.  3. Diabetes.  4. Right rotator cuff syndrome with associated right upper extremity      spasticity.   PLAN:  1. Continue baclofen 10 mg  b.i.d.  2. After informed consent, we injected the right shoulder via      posterior approach with 4 mg of Kenalog and 3 mL of 1% lidocaine.      Patient tolerated well.  Advised aggressive range of motion and      stretching to the shoulder and arm unit over the next several      weeks' time.  I think she can work through this with repetitive      exercise and stretching.  3. Continue heat and ice as needed to the shoulder.  4. I will see her back in three months' time.      Ranelle Oyster, M.D.  Electronically Signed    ZTS/MedQ  D:  07/08/2007 10:39:23  T:  07/08/2007 11:41:19  Job #:  [id number]   cc:   Molly Maduro A. Nicholos Johns, M.D.  Fax: [phone]

## 2010-11-18 NOTE — Consult Note (Signed)
[name], [name]                ACCOUNT NO.:  [id number]   MEDICAL RECORD NO.:  [id number]          PATIENT TYPE:  INP   LOCATION:  3032                         FACILITY:  [facility]   PHYSICIAN:  Casimiro Needle L. Reynolds, M.D.DATE OF BIRTH:  1955/11/26   DATE OF CONSULTATION:  01/05/2007  DATE OF DISCHARGE:                                 CONSULTATION   REQUESTING PHYSICIAN:  Dr. Donnalee Curry   REASON FOR EVALUATION:  Stroke.   HISTORY OF PRESENT ILLNESS:  This is the initial inpatient consultation  evaluation of this 55 year old woman with multiple medical problems.  She had discontinued all her medications approximately 1 year ago  because she thought that she was feeling well.  She presented to the  emergency department yesterday with numbness and mild weakness of the  right hand and arm as well as slight slurred speech and drooping of the  face.  In the emergency department she had a blood pressure of 210/110  and a blood glucose of 284.  She was admitted to the hospital for  presumed stroke.  A neurologic consultation is requested.  She has had  an MRI of the brain which is available for review.  She states that her  symptoms have improved somewhat since admission.   PAST MEDICAL HISTORY:  Remarkable for hypertension, diabetes and  hyperlipidemia, as well as obesity.  As noted above, she stopped taking  all of her medications about a year ago.   FAMILY/SOCIAL/REVIEW OF SYSTEMS:  As outlined in the admission H&P by  Dr. Rito Ehrlich on January 04, 2007, which is reviewed.   MEDICATIONS:  She was not taking any medications prior to admission as  above.  In the hospital she has been started on Lantus and sliding scale  insulin as well as several p.r.n. medications.   PHYSICAL EXAMINATION:  VITAL SIGNS:  Temperature 97.9, blood pressure  164/89, pulse 80, respirations 20, O2 saturation 94% on room air.  GENERAL EXAMINATION:  This is a healthy-appearing woman seated in a  hospital bed, no  evident distress.  HEAD:  Cranium is normocephalic and atraumatic.  Oropharynx benign.  NECK:  Supple without carotid or supraclavicular regular bruits.  HEART:  Regular rate and rhythm without murmurs.  NEUROLOGIC EXAMINATION:  Mental status:  She is awake and alert.  She is  oriented to time and place.  Recent and remote memory are intact.  Attention span, concentration and fund of knowledge are all appropriate.  Speech is fluent and slightly dysarthric.  Cranial nerves:  Pupils equal  and reactive.  Extraocular was full without nystagmus.  Visual fields  full to confrontation.  Hearing intact.  Conversational speech.  Face  symmetric, palate moves normally and symmetrically.  There is some mild  right facial droop and weakness present.  Tongue and palate move  symmetrically.  Motor:  Normal bulk and tone.  She has very slight  weakness of the intrinsic muscles of the right hand, otherwise normal  strength throughout.  Sensation intact to light touch throughout.  Coordination:  Rapid movements are performed slowly on the right hand  compared to the left.  Finger-nose performed a clumsily on the right  hand compared the left.  Gait:  She arises easily from a chair and  stance is normal.  She can ambulate without difficulty.  Reflexes 2+ and  symmetric.  Toes are downgoing bilaterally.   LABORATORY REVIEW:  CBC on admission:  White count 6.91; hemoglobin  14.4; platelets 256,000.  Coags are normal.  CMET on admission  remarkable for a low potassium of 3.1, elevated glucose of 284.  Lipid  panel from this morning:  HDL 31, LDL 196.  Hemoglobin A1c 13.8.  MRI of  the brain performed earlier today is personally reviewed and this  demonstrates an acute left subcortical stroke in the putamen extending  up through the internal capsule and into the caudate head.  MRI  demonstrates no significant large-vessel disease.  She does have  evidence of significant remote small-vessel disease of the MRI  of the  brain.   IMPRESSION:  Left brain subcortical stroke resulting in mild right  hemiparesis in a patient with multiple uncontrolled risk factors.   RECOMMENDATIONS:  She was not taking aspirin at the time of her stroke  and so this does not constitute an aspirin failure.  Therefore, will  start aspirin daily.  Most which she needs is education of and control  of her numerous risk factors including hypertension, diabetes, and  hyperlipidemia.  Medications for these should be started.  She will need  close follow-up with her primary physician as well.  Stroke service to  follow up on tests.   Thank you for this consultation.      Michael L. Thad Ranger, M.D.  Electronically Signed     MLR/MEDQ  D:  01/05/2007  T:  01/06/2007  Job:  [id number]

## 2010-11-18 NOTE — H&P (Signed)
[name], [name]                ACCOUNT NO.:  [id number]   MEDICAL RECORD NO.:  [id number]          PATIENT TYPE:  EMS   LOCATION:  MAJO                         FACILITY:  [facility]   PHYSICIAN:  Hollice Espy, M.D.DATE OF BIRTH:  1956-05-12   DATE OF ADMISSION:  01/04/2007  DATE OF DISCHARGE:                              HISTORY & PHYSICAL   PRIMARY CARE PHYSICIAN:  Elias Else, M.D.   CHIEF COMPLAINT:  Slurred speech and right arm numbness.   HISTORY OF PRESENT ILLNESS:  The patient is a 55 year old African-  American female with past medical history of obesity, diabetes mellitus,  hypertension, and hyperlipidemia, who stopped taking all of her  medications approximately 1 year ago. She stopped taking them because  she thought that she was getting better on her own without medicines.  Since that time, she has been doing well and then approximately 24 hours  ago, she started noticing some right arm numbness and some mild  weakness. She also felt as if her mouth was drooping and she had some  problems with slurred speech. Over the next 24 hours, her symptoms have  mildly improved but they are still persistent. When she came to the  emergency room, she had a CT scan of the head checked, which was found  to show some old lacunar infarcts but no evidence of acute CVA. In  addition, labs were ordered on the patient and she was found to have a  blood pressure of 210/110, briefly dipped down to 158/72, and now to  193/110. The rest of her labs are notable for a glucose of 284. It was  suspected that the patient had an acute, sub-acute CVA secondary to  medical non-compliance from all of her comorbidities and need to come in  for further evaluation, treatment, and stabilization.   REVIEW OF SYSTEMS:  Currently, she is doing okay. She denies headache,  acute vision change, dysphagia. No chest pain or palpitations. She does  complain of some mouth drooping and dry mouth. She complains  of some  right arm numbness, although she says it is better than when it first  started. She denies any shortness of breath, wheezing, coughing. No  abdominal pain. No hematuria, dysuria, constipation, diarrhea, focal  ext. numbness, weakness, or pain of her lower extremities. Her review of  systems is otherwise negative.   PAST MEDICAL HISTORY:  Hypertension, hyperlipidemia, obesity, medical  non-compliance, and diabetes mellitus.   MEDICATIONS:  She cannot recall what medicines she was taking a year  ago.   ALLERGIES:  NO KNOWN DRUG ALLERGIES.   SOCIAL HISTORY:  She denies any alcohol, tobacco, or hard drug use.   FAMILY HISTORY:  Diabetes.   PHYSICAL EXAMINATION:  VITAL SIGNS:  On admission, temperature 97.5,  heart rate 74, blood pressure 210/110 and as low at 158/73 and up to  193/110 now. Respiratory rate 24. O2 sat 94% on room air.  GENERAL:  Alert and oriented times three. No apparent distress.  HEENT:  Normocephalic and atraumatic. Mucous membranes are dry. She has  no carotid bruits.  NEUROLOGIC:  Cranial nerves 2-12 are relatively intact with the  exception, she has a mild right facial droop. She has good coordination.  No evidence of any cerebellar dysfunction. She has some generalized  weakness but her upper and lower extremities flexion and extension  appear to be intact with no focal deficits.  HEART:  Regular rate and rhythm. S1 and S2 with 2 out of 6 systolic  ejection murmur.  LUNGS:  Clear to auscultation bilaterally but limited secondary to body  habitus.  ABDOMEN:  Soft, obese, nontender, nondistended. Positive bowel sounds.  EXTREMITIES:  No clubbing, cyanosis, or edema with 2+ pulses. She has no  sensory deficits, negative Babinski.   DIAGNOSTIC STUDIES:  CT scan of the head is as per HPI.   LABORATORY DATA:  White count 6.9. Hemoglobin and hematocrit 14.4 and  46.3. MCV of 70, platelet count 256,000 with no shift. Sodium 135,  potassium 3.1, chloride  101, bicarb 27, BUN 6, creatinine 0.5, glucose  284. Liver function studies unremarkable. Coag's unremarkable. UA shows  greater than 1,000 glucose, 100 of protein, but only 0 to 2 red cells  with rare bacteria.   ASSESSMENT/PLAN:  1. SUSPECTED ACUTE CEREBROVASCULAR ACCIDENT:  Already, the patient has      documented previous stroke by CT, although she was unaware of this.      She is at high risk for further severe stroke, given her medical      noncompliance and her multiple comorbidities. Will plan to admit to      the hospital. Start on aspirin plus prophylactic Lovenox. Will put      her on p.r.n. Clonidine for now, as we do not want to lower her      blood pressure too greatly. Will check a MRI and MRA to confirm      CVA. Check carotid Doppler's and a 2-D echo.  2. HYPERLIPIDEMIA:  Will check a fasting lipid profile. Need to start      on a Statin.  3. HYPERTENSION:  See above. Her best medication likely may need to be      an ace inhibitor, given her diabetes.  4. DIABETES MELLITUS:  Will need to restart her on oral medicines.      Check a hemoglobin A1C. For now, will cover with a sliding scale.      Will also check a bedside swallowing evaluation to confirm that she      has no dysphagia issues.  5. OBESITY.  6. MEDICAL NONCOMPLIANCE.  7. HYPERKALEMIA.  8. MICROCYTIC ANEMIA. Hemoglobin and hematocrit is currently stable.      Hollice Espy, M.D.  Electronically Signed     SKK/MEDQ  D:  01/04/2007  T:  01/04/2007  Job:  [id number]   cc:   Kela Millin, M.D.  Robert A. Nicholos Johns, M.D.  Pramod P. Pearlean Brownie, MD

## 2010-11-18 NOTE — Discharge Summary (Signed)
[name], [name]                ACCOUNT NO.:  [id number]   MEDICAL RECORD NO.:  [id number]          PATIENT TYPE:  IPS   LOCATION:  4004                         FACILITY:  [facility]   PHYSICIAN:  Ranelle Oyster, M.D.DATE OF BIRTH:  Aug 24, 1955   DATE OF ADMISSION:  01/18/2007  DATE OF DISCHARGE:  01/25/2007                               DISCHARGE SUMMARY   DISCHARGE DIAGNOSES:  1. Left basal ganglia subcortical cerebrovascular accident.  2. Hypertension.  3. Hyperlipidemia.  4. Insulin-dependent diabetes mellitus.  5. Subcutaneous Lovenox for deep vein thrombosis prophylaxis.   This is a 55 year old black female with history of hypertension,  uncontrolled diabetes mellitus with poor medical compliance, who was  recently admitted July 01-07, 2008, for left brain subcortical  cerebrovascular accident, placed on Ecotrin therapy for infarction.  Readmitted January 14, 2007, with increased right-sided weakness.  MRI  showed extension of stroke in the left basal ganglia.  MRA negative.  Recent echocardiogram with ejection fraction 60% without emboli.  Carotid Dopplers negative.  Follow-up neurology services, Dr. Sharene Skeans,  advised to continue aspirin therapy.  Subcutaneous Lovenox was added for  deep vein thrombosis prophylaxis.  Ecotrin later discontinued with the  addition of Aggrenox per Dr. Pearlean Brownie of neurology services after MRI had  been reviewed.   PAST MEDICAL HISTORY:  See discharge diagnoses.  No alcohol or tobacco.   ALLERGIES:  NONE.   SOCIAL HISTORY:  [place]  She  works at [employer].  Husband can assist except for limited lifting  after recent shoulder surgery.  They live in a one level home.   MEDICATIONS PRIOR TO ADMISSION:  1. Catapres patch 0.2 mg weekly.  2. Lisinopril 20 mg twice daily.  3. Hydrochlorothiazide 25 mg daily.  4. Aspirin 325 mg daily.  5. Amaryl 2 mg daily.  6. Lantus insulin 25 units at bedtime.  7.  Zocor 20 mg daily.   REHABILITATION HOSPITAL COURSE:  The patient was admitted to inpatient  rehab services with therapies initiated on a three-hour daily basis  consisting of physical therapy, occupational therapy, speech therapy and  rehabilitation nursing.  The following issues were addressed during the  patient's rehabilitation stay.  Pertaining to [name]'s left basal  ganglia subcortical cerebrovascular accident, maintained on Aggrenox  therapy.  Functionally, she was supervision for transfers, minimal  assist for ambulation with a large base quad cane, minimal assist for  stairs, minimal assist for lower body activities of daily living.  It  was felt that her complex receptional an expressive language were within  normal limits.  She exhibited no swallowing difficulties.  She was  maintained on subcutaneous Lovenox throughout her rehab course for deep  vein thrombosis prophylaxis.  Blood pressures monitored with diastolic  pressures 64-73.  She remained on hydrochlorothiazide as well as  lisinopril.  Blood sugars monitored of 114, 101 and 148.  It was noted  she had a hemoglobin A1c of 13.8 on admission.  She remained on Amaryl  as well as Lantus insulin.  It was discussed at length the need for  maintaining of diabetic diet and continue medications as advised.   Latest labs showed hemoglobin 13.4, hematocrit 42.8, platelet 288,000.  Sodium 140, potassium 4.1, BUN 30, creatinine 1.1.   DISCHARGE MEDICATIONS:  At time of dictation included  1. Zocor 20 mg p.o. daily.  2. Aggrenox one capsule twice daily.  3. Hydrochlorothiazide 25 mg daily.  4. Amaryl 2 mg daily.  5. Lisinopril 20 mg twice daily.  6. Lantus insulin 25 units at bedtime.  7. Protonix 40 mg daily.   DIET:  Diabetic diet.   SPECIAL INSTRUCTIONS:  No drinking, no driving, no smoking.  Home health  therapies as advised per rehab services.   FOLLOW-UP:  1. Dr. Faith Rogue at the outpatient rehab service  office as      advised.  2. Dr. Nicholos Johns, medical management.  3. Dr. Pearlean Brownie, neurology services.      Mariam Dollar, P.A.      Ranelle Oyster, M.D.  Electronically Signed    DA/MEDQ  D:  01/24/2007  T:  01/24/2007  Job:  [id number]   cc:   Molly Maduro A. Nicholos Johns, M.D.  Pramod P. Pearlean Brownie, MD

## 2010-11-18 NOTE — Assessment & Plan Note (Signed)
[name] is back regarding her left basal ganglia stroke. She has been at  home since the 22. She is receiving outpatient therapies. She is doing  quite well. She still has soreness and weakness in the right upper  extremity. Her mood has been good. She is walking with the cane for  longer distance but without at home. Sugars have been well controlled  and actually low if anything. Blood pressure has been within normal  limits. She tries to walk and exercise daily. She has not changed any of  her medications since we discharged her from the hospital. She remains  on:  1. Catapres.  2. Lisinopril.  3. Hydrochlorothiazide.  4. Aspirin.  5. Amaryl 2 mg daily.  6. Lantus 25 mg at bedtime.   REVIEW OF SYSTEMS:  Notable for the above with full review of systems in  written health and history section of the chart.   SOCIAL HISTORY:  Patient is married and husband is very supportive. He  is here today with her at the office.   PHYSICAL EXAMINATION:  Blood pressure is 123/68, pulse 74, respiratory  rate 18, she is sating at 97% in room air. Patient is pleasant, alert  and oriented x3. Patient's right upper extremity notable for traced 1+  swelling non pitting type. She has some discomfort with passive movement  to the wrist and shoulder today. Shoulder is a bit restricted with end  range abduction and rotation. Strength at the right shoulder was 1+ to 2  out of 5. Elbow movement was 3 to 3+ out of 5. Right wrist was 2+ to 3  out of 5. Sensation was grossly intact. Right lower extremity was 4 to  4+ out of 5. She did have decreased fine motor coordination overall. She  had a mild right central 7th, but overall the rest of her cranial nerve  exam was intact. Cognitively she was appropriate. Mood was excellent.  HEART: Regular.  CHEST: Clear.  ABDOMEN: Soft, nontender. Patient remains grossly overweight.  I examined the patient's gait today and she had fair weight shift. She  does favor the  right leg a bit in stance phase but really does well with  her balance. She is slightly wide based.   ASSESSMENT:  1. Left basal ganglia stroke.  2. Hypertension.  3. Insulin requiring diabetes.   PLAN:  1. Continue outpatient therapies. She is making nice progress. We      talked about ways to work on her edema control including elevation,      massage, use of the hand, potential compression garments, etc. This      should improve as her function improves.  2. I will decrease Lantus insulin to 15 units at bedtime.  3. I will see her back in 3 months time. She should follow up with her      family physician in the meantime to adjust medication. She may be      able to come off the insulin.      Ranelle Oyster, M.D.  Electronically Signed     ZTS/MedQ  D:  03/04/2007 10:49:57  T:  03/05/2007 10:11:54  Job #:  [id number]   cc:   Molly Maduro A. Nicholos Johns, M.D.  Fax: [phone]

## 2010-11-18 NOTE — Discharge Summary (Signed)
[name], [name]                ACCOUNT NO.:  [id number]   MEDICAL RECORD NO.:  [id number]          PATIENT TYPE:  INP   LOCATION:  3032                         FACILITY:  [facility]   PHYSICIAN:  Kela Millin, M.D.DATE OF BIRTH:  1956-05-14   DATE OF ADMISSION:  01/04/2007  DATE OF DISCHARGE:  01/10/2007                               DISCHARGE SUMMARY   DISCHARGE DIAGNOSES:  1. Left brain subcortical stroke with mild right hemiparesis and right      facial droop.  2. Malignant hypertension.  3. Uncontrolled diabetes mellitus.  4. Hyperlipidemia.  5. Hypokalemia, resolved.  6. Medical noncompliance.  7. History of anemia.   PROCEDURES AND STUDIES:  1. CT scan of the brain:  No acute intracranial abnormalities, lacunar      infarct in the left basal ganglia, chronic small vessel white      matter disease.  2. MRI of the brain:  Acute infarct involving the posterior aspect of      the putamen extending cephalad through the posterior limb of the      internal capsule to the body of the caudate.  Moderate      periventricular and subcortical T2 hyperintensities bilaterally.      Remote blowout fracture of the right globe.  3. MRA:  Mild narrowing of superior segment of the M2 segment,      otherwise unremarkable MRA.  4. Two-dimensional echocardiogram:  Overall left ventricular systolic      function normal, ejection fraction 60%.  Doppler parameters      consistent with abnormal left ventricular relaxation.  Left      ventricular wall thickness mildly increased.  5. Carotid Doppler ultrasound:  No evidence of significant ICA      stenosis.   CONSULTATIONS:  Neurology, Marolyn Hammock. Thad Ranger, M.D.   HISTORY AND PHYSICAL:  The patient is a 55 year old black female with  above-listed medical problems who stopped taking all of her medications  for about one year and presented with slurred speech and right arm  numbness.  She reported she had stopped them because she thought she  was  getting better on her own without the medications.  She stated that  about 24 hours prior to admission she began noticing some right arm  numbness and some mild weakness. She then felt as if her mouth was  drooping and had some problems  with slurring of her speech.  The  symptoms persisted, and so she subsequently came to the emergency room.  In the ER, she had a CT scan done which revealed some lacunar infarcts  but no evidence of an acute CVA.  The patient's blood pressure initially  was noted to be 210/110 in the ER.  Her blood glucose was 284, and she  was admitted for further evaluation and management.   Physical examination upon admission as per Dr. Rito Ehrlich revealed  initially a blood pressure of 210/110 and later 193/110.  Temperature  97.5, pulse 74, O2 saturation 94% on room air.  Other pertinent findings  on exam on HEENT she had dry mucous membranes, and  on her neurologic  exam, cranial nerves II-XII were reported to be relatively intact with  the exception of a mild right facial droop and also reportedly had some  generalized weakness in her upper and lower extremities.  Flexion and  extension appeared to be intact with no focal deficits.   LABORATORY DATA:  White cell count 6.9, hemoglobin 14.4, hematocrit  46.3, MCV 70, platelet count 256.  Sodium 135, potassium 3.1, chloride  101, bicarb 27, BUN 6, creatinine 0.5, glucose 284.  LFTs unremarkable.  Urinalysis showed greater than 1000 glucose, 100 protein, but only 0-2  red blood cells and rare bacteria.   HOSPITAL COURSE:  #1.  LEFT BRAIN SUBCORTICAL STROKE WITH MILD RIGHT HEMIPARESIS AND RIGHT  FACIAL DROOP:  Upon admission, the patient had a CT scan of the brain  with results as stated above.  She had a followup MRI which showed the  stroke as above.  The patient was initially placed on Aggrenox as she  reported she had been on aspirin.  The neurologist further talked with  the patient about her aspirin use, and  she admitted she stopped taking  the aspirin as well, and so this was considered to not be an aspirin  failure, and the Aggrenox was discontinued.  (Also, the patient was not  tolerating this in the hospital, and patient was placed on aspirin 325  mg daily.)  A 2-D echocardiogram was done as well as carotid Doppler  ultrasound with results as stated above.  The patient also had a  homocysteine level which was within normal limits at 6.1.  She had a  fasting lipid profile done while in the hospital, and total cholesterol  was high at 266, LDL 196, HDL 31.  The patient was started on Zocor.  It  was noted overall that patient had multiple uncontrolled risk factors.  Her hemoglobin A1c was elevated at 13.8 and markedly high blood pressure  as documented above.  The patient was educated on staying on her  medications, and the risk factors for stroke as well as overall  cardiovascular risks.  Neurology followed the patient in the hospital  during her stay and recommended she follow up with Dr. Pearlean Brownie in 2  months.  The patient has been educated on staying on her medications,  and her husband has agreed to help her do so.  She is to follow up with  her primary care physician.  Physical therapy and occupational therapy  were consulted while in the hospital, and following physical therapy  evaluation, they recommended that patient did not require any further  outpatient physical therapy, but OT stated that the patient would  require occupational therapy at the [facility].   #2.  UNCONTROLLED DIABETES MELLITUS:  The patient's Accu-Chek's were  monitored during her hospital stay.  As above, her hemoglobin A1c was  elevated at 13.8.  She was started on Lantus, and the dose adjusted for  blood glucose control.  She had diabetes education in the hospital and  is also to be scheduled for outpatient diabetes education.  The patient  also was placed on Amaryl along with Lantus to  optimize her blood  glucose control.  She is to keep a log of her blood sugars, stay on a  diabetic diet, and follow up with her primary care physician.   #3.  MALIGNANT HYPERTENSION:  As discussed above, she was started on  lisinopril initially, and the dose was adjusted.  Because her  blood  pressure was hard to control, hydrochlorothiazide was added and  subsequently a clonidine patch also added.  In the past 24-48 hours, her  blood pressure has been significantly better controlled.  Her last blood  pressure prior to discharge is 134/76.  She is not having any headaches  or other symptoms.   #4.  HYPERLIPIDEMIA:  As discussed above, the patient ws placed on Zocor  and is to continue this upon discharge.   #5.  HYPOKALEMIA:  Her potassium was replaced in the hospital.  Her last  potassium prior to discharge was 3.6.   DISCHARGE MEDICATIONS:  1. Catapres patch 0.2 mg weekly.  2. Lisinopril 20 mg 1 p.o. b.i.d.  3. Hydrochlorothiazide 25 mg 1 p.o. daily.  4. Enteric-coated aspirin 325 mg with food daily.  5. Amaryl 2 mg 1 p.o. daily.  6. Lantus 25 units subcutaneously nightly.  7. Zocor 20 mg p.o. nightly.   FOLLOWUP CARE:  1. Robert A. Nicholos Johns, M.D., in one week.  Patient is to call for      appointment.  2. Pramod P. Pearlean Brownie, MD, in 2 months.  Patient is to call for      appointment.  3. Outpatient rehab at [facility] for occupational therapy.   CONDITION ON DISCHARGE:  Improved and stable.      Kela Millin, M.D.  Electronically Signed     ACV/MEDQ  D:  01/10/2007  T:  01/10/2007  Job:  [id number]   cc:   Molly Maduro A. Nicholos Johns, M.D.  Pramod P. Pearlean Brownie, MD

## 2010-11-18 NOTE — H&P (Signed)
[name], [name]                ACCOUNT NO.:  [id number]   MEDICAL RECORD NO.:  [id number]          PATIENT TYPE:  IPS   LOCATION:  4004                         FACILITY:  [facility]   PHYSICIAN:  Ranelle Oyster, M.D.DATE OF BIRTH:  1956/04/30   DATE OF ADMISSION:  01/18/2007  DATE OF DISCHARGE:                              HISTORY & PHYSICAL   CHIEF COMPLAINT:  Right-sided weakness.   HISTORY OF PRESENT ILLNESS:  This 55 year old black female with history  hypertension and uncontrolled diabetes admitted initially on 07/01 for  left brain subcortical stroke for which she was placed on aspirin.  She  is readmitted on 07/11 with increased right-sided weakness.  MRI  revealed extension of her stroke the left basal ganglia.  Echocardiogram  was unremarkable.  No emboli were found.  Neurology saw the patient and  recommended continuing aspirin therapy with an improvement of her  overall medical hygiene.  The patient was placed on subcu Lovenox for  DVT prophylaxis.  The patient was later placed on Aggrenox per Dr. Pearlean Brownie  after reviewing her MRI.  The patient continues to struggle with right-  sided weakness and thus was brought to inpatient rehab unit today.   REVIEW OF SYSTEMS:  Notable for weakness and numbness in the right upper  and lower extremities today.  Mood has been fair.  Appetite has been  good.  She denies shortness of breath, chest pain.  She has been  sleeping fairly well.  She has had some urinary frequency to a mild  extent.  She denies any problems with constipation.  Full review is in  the written history and physical.   PAST MEDICAL HISTORY:  Positive hypertension, uncontrolled diabetes,  hyperlipidemia, chronic anemia.  She denies alcohol or tobacco use.  The  patient is obese as well.   FAMILY HISTORY:  Positive for diabetes.   SOCIAL HISTORY:  The patient lives with her husband in [place].  She  works at [employer].  Husband can assist on a light level due  to a  recent shoulder surgery.  They have one level home.   FUNCTIONAL HISTORY:  The patient was independent prior to arrival.  Currently the patient is requiring min assist for mobility min to mod  for ADLs.   ALLERGIES:  None.   HOME MEDICATIONS:  Catapres patch 0.2 mg q. week lisinopril 20 mg  b.i.d., hydrochlorothiazide 25 mg daily, aspirin 325 mg daily, Amaryl 2  mg daily, Lantus 25 units  q.h.s., Zocor 20 mg daily.  There is question  of compliance with all these medications.   LABS:  Hemoglobin 15.3, white count 7.6, platelets 309,000.  Sodium 136,  potassium 3.8, BUN and creatinine 28 and 1.0.  Hemoglobin A1c on  admission 13.8.   PHYSICAL EXAM:  Blood pressure 116/81, pulse 71, respiratory rate 16.  Temperature 97.9.  The patient is generally pleasant.  She is obese,  alert and oriented x3.  HEENT:  Pupils equal, round reactive to light accommodation.  Extraocular eye movements are intact.  Sclerae are anicteric.  Ear,  nose, throat exam is unremarkable  except for thrush on the tongue.  NECK is supple without JVD or lymphadenopathy.  CHEST: Clear to auscultation bilaterally without wheezes, rales or  rhonchi  HEART:  Regular rate and rhythm without murmur, rub or gallops.  EXTREMITIES:  Showed no clubbing, cyanosis or edema.  NEUROLOGICALLY:  Cranial nerve exam notable for right central VII.  Otherwise exam was notable for mild right sided periorbital numbness.  Sensation was decreased at 1/2 in the right hand and right arm and leg  as well.  Speech was clear and articulate.  Judgment, orientation,  memory and mood  within functional limits.  Right upper extremity  strength is trace to 1/5.  Right lower extremity is  2+-3/5 proximal  distal.   ASSESSMENT/PLAN:  1. Functional deficits secondary to left basal ganglia stroke.  Begin      comprehensive inpatient rehab with PT to assess and treat for range      of motion, strengthening, mobility, transfers and gait.  OT  will      assess for range of motion, strengthening, ADLs and equipment.      Rehab nurse will follow on 24-hour basis for bowel, bladder, skin,      medication, safety issues.  Case manager/social worker will assess      for psychosocial needs and discharge planning.  Estimated length of      stay is 2 weeks plus.  Prognosis good.  Goals modified independent      with mobility and supervision to modified independent with self-      care.  2. Hypertension:  Lisinopril/HCTZ  3. Hyperlipidemia:  Zocor.  4. Diabetes:  Check CBCs a.c. and h.s..  The patient will maintain on      Amaryl and Lantus insulin for now.  5. Stroke prophylaxis: Aggrenox b.i.d.  6. DVT prophylaxis:  subcu Lovenox 40 mEq daily.      Ranelle Oyster, M.D.  Electronically Signed     ZTS/MEDQ  D:  01/18/2007  T:  01/18/2007  Job:  [id number]

## 2010-11-18 NOTE — H&P (Signed)
[name], [name]                ACCOUNT NO.:  [id number]   MEDICAL RECORD NO.:  [id number]          PATIENT TYPE:  INP   LOCATION:  3033                         FACILITY:  [facility]   PHYSICIAN:  Linda Gardener, MD    DATE OF BIRTH:  [birth year]   DATE OF PROCEDURE:  DATE OF DISCHARGE:                    STAT - MUST CHANGE TO CORRECT WORK TYPE   PRIMARY CARE PHYSICIAN:  [name], M.D.   CHIEF COMPLAINT:  Increasing weakness and slurred speech.   HISTORY OF PRESENT ILLNESS:  This is a 55 year old female who was  admitted recently to the hospital on January 04, 2007 and was discharged on  January 10, 2007 after evaluation of left brain subcortical stroke.  The  patient left the hospital last Sunday.  At that time, she had mild right  hemiparesis and right facial droop.  Patient followed up with her  primary physician the next day, and she has been doing fine.  Over the  last couple of days, she had gradual worsening of her condition where  she became more weak in her right side.  She also developed numbness in  the right side of her face and in her right upper extremity.  Her speech  also became slurred.  She denied blurred vision, denied incontinence.  Came into the ER today for further evaluation where MRI was done, and it  showed worsening of her stroke.  The patient was seen by neurology in  the ER, and they will follow the patient as a consult.   PAST MEDICAL HISTORY:  1. 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  2. Hypertension.  3. Uncontrolled diabetes mellitus.  4. Hyperlipidemia.  5. Hypokalemia.  6. Medical noncompliance.  7. History of anemia.   MEDICATIONS:  1. Catapres patch 0.2 mg once weekly.  2. Lisinopril 20 mg p.o. twice daily.  3. Hydrochlorothiazide 25 mg once daily.  4. Aspirin 325 mg p.o. once daily.  5. Amaryl 2 mg p.o. once daily.  6. Lantus 25 units subcutaneously at bedtime.  7. Zocor 20 mg p.o. once  daily.   ALLERGIES:  No known drug allergies.   PAST SURGICAL HISTORY:  Denied.   SOCIAL HISTORY:  Denies smoking, denies alcohol drinking, denies  recreational drugs.   FAMILY HISTORY:  Positive for diabetes.   REVIEW OF SYSTEMS:  As per H&P.   PHYSICAL EXAMINATION:  VITAL SIGNS:  Temperature is 98.2.  Blood  pressure is 116/75.  Pulse 78.  Respiratory rate 21.  GENERAL:  This is a middle-aged African-American female who is lying  down in bed in no acute distress.  HEENT:  Conjunctivae shows no blood, no erythema.  Her pupils are equal,  round and reactive to light and accommodation.  There is no ptosis.  Hearing is intact.  There is no ear discharge or infection.  There is no  nose discharge.  There is no nose bleeding.  Oral mucosa is dry.  No  pharyngeal erythema.  NECK:  Supple.  No JVD.  No carotid bruit.  No lymphadenopathy.  No  thyroid enlargement or thyroid tenderness.  CARDIOVASCULAR:  S1 and S2 are regular.  There is no murmurs, gallops,  or thrills.  RESPIRATORY:  Patient is breathing between 16-18.  No use of accessory  muscles.  No intercostal retractions.  No dullness.  No rales, rhonchi  or wheezes.  ABDOMEN:  Soft, nondistended.  __________ organomegaly.  Bowel sounds  are normal.  Umbilicus central.  LOWER EXTREMITIES:  No edema, no rash, no varicose veins.  SKIN:  No rash.  No erythema.  NEURO:  Patient is awake, alert and oriented x3.  Strength is 1/5 in the  right upper extremity, 1/5 in the right lower extremity, and 5/5 in the  left upper and left lower extremities.  Sensation is intact to pain and  touch sensation.  Reflexes are diminished in the right side.  Cranial  nerves showing right-sided droop and slurred speech.   LAB RESULTS:  WBC 7.6, hemoglobin 15.3, hematocrit 48.8, MCV 78.3.  Her  platelet count is 309.  Sodium 136, potassium 3.8, chloride 99, bicarb  25, glucose 110, BUN 28, creatinine 1.03, AST 40, INR 1.   No EKG is available and  will get a STAT EKG.   MRI of the brain showed extension of worsening of the underlying stroke  as did the neurologist.   ASSESSMENT/PLAN:  1. Acute cerebrovascular accident:  I will admit this patient to the      stroke unit.  This patient already has been on aspirin without      improvement.  I will switch her to Aggrenox 1 cap p.o. twice daily.      I will continue to control her risk factors, including      hypertension, diabetes, and her hyperlipidemia.  I will put her      back on her medications and will follow her blood pressure and      sugar very closely.  Patient is already seen by neurology and will      follow with them.  I will get PT/OT evaluation as well as swallow      and speech evaluation.  I will keep the patient n.p.o. until      evaluated by swallow and speech.  Meanwhile, will keep her in      normal saline at 100 cc/hr.  2. Diabetes mellitus:  Will resume her Lantus and Amaryl and will put      her on NovoLog with meals.  Will get regular fingerstick.  3. Hypertension:  Will continue her current medications and will try      to keep her blood pressure above blood      pressure because of her acute setting of stroke.  4. Hyperlipidemia:  Will continue her Zocor and will get lipid      profile.   Total assessment time is one hour.      Linda Gardener, MD  Electronically Signed     NAE/MEDQ  D:  01/14/2007  T:  01/14/2007  Job:  [id number]   cc:   [name], M.D.

## 2010-11-18 NOTE — Discharge Summary (Signed)
[name], CARNEGIE                ACCOUNT NO.:  [id number]   MEDICAL RECORD NO.:  [id number]          PATIENT TYPE:  INP   LOCATION:  3033                         FACILITY:  [facility]   PHYSICIAN:  Michelene Gardener, MD    DATE OF BIRTH:  11-17-1955   DATE OF ADMISSION:  01/14/2007  DATE OF DISCHARGE:  01/18/2007                               DISCHARGE SUMMARY   DISCHARGE DIAGNOSES:  1. Acute cerebrovascular accident.  2. Diabetes mellitus.  3. Hypertension.  4. Hyperlipidemia.  5. History of anemia.  6. Medical noncompliance.   DISCHARGE MEDICATIONS:  1. Aggrenox one capsule p.o. once daily.  2. Catapres patch 0.2 mg p.o. once weekly.  3. Lisinopril 20 mg p.o. twice daily.  4. Hydrochlorothiazide 25 mg p.o. once daily.  5. Amaryl 2 mg p.o. once daily.  6. Lantus 25 units subcutaneously at bedtime.  7. Zocor 20 mg p.o. once daily at bedtime.   CONSULTATIONS:  Neurology consultation by Dr. Faith Rogue.   PROCEDURES:  None.   FOLLOW-UP APPOINTMENTS:  This patient will be sent to a rehab and then  will be followed by her primary physician.   RADIOLOGY STUDIES:  MRI of the brain with and without contrast showed  extension of the stroke in the postero-anterior on the left side with  new punctuate focus of acute infarction in the left posterior frontal  deep white matter.  MRA of the brain showed no acute changes.   ASSESSMENT:  This patient is a 55 year old African-American female who  was recently admitted to the hospital with a left brain subcortical  stroke.  This patient was sent to the hospital and was started on  physical therapy.  She came again to the hospital with increasing right  hemiparesis and right facial droop.  Her MRI of the brain showed  extension of her stroke.  The patient was admitted to the neurology  floor for further evaluation.  Her aspirin was discontinued.  She was  started on Aggrenox.  The patient has been followed by neurology during  the  hospitalization.  The patient was evaluated by swallow and speech  who recommended to start her on diet and that was increased.  At the  time of discharge, she is taking a regular diet without problem.  Also  seen by PT and OT and was recommended to go for inpatient  rehabilitation.  Responded well to physical therapy, and she was able to  do more movements.  When she came, she could not move her right side,  and currently she can walk and can move her right hand from side to  side.  Will be discharged on the above-mentioned medications and will be  followed by primary physician.   DISPOSITION:  Otherwise other medical conditions remained stable and  other medications will be continued as the same taken at home.   TOTAL ASSESSMENT TIME:  One hour.      Michelene Gardener, MD  Electronically Signed     NAE/MEDQ  D:  02/09/2007  T:  02/09/2007  Job:  [id number]   cc:  Robert A. Alyson Ingles, M.D.

## 2010-11-18 NOTE — Assessment & Plan Note (Signed)
REASON FOR VISIT:  [name] is back following her left basal ganglia  stroke.  She is doing fairly well from a spasticity standpoint.  The  right shoulder injection we did in January has helped her.  She still  does not have a lot of range of motion in the shoulder.  The pain is  down to a 3 to 4 out of 10.  It bothers her sometimes when she sleeps.  Sleep, overall, is fair however.  She is walking without a device  currently.  She states in general her lower extremities have been stable  in regards to strength and stability.   REVIEW OF SYSTEMS:  Notable for bowel and bladder control issues at  times as well as numbness and tingling, trouble walking, depressed mood.  Sugar's have been under better control.  She has lost some weight  overall.   SOCIAL HISTORY:  The patient is married and her husband is with her  today.   PHYSICAL EXAMINATION:  VITAL SIGNS:  Blood pressure is 144/70, pulse 65,  respiratory rate 18.  She is sating 97% on room air.  GENERAL:  The patient is pleasant, alert and oriented x3.  She continues  to have 2-3/5 strength in right upper extremity with minimal tone today.  She has some tightness at the right shoulder which is not frank  spasticity.  She is weak particularly in the right deltoid and cannot  actively abduct past about 45 degrees.  Was passively able to move her  to 90 to 120 degrees of abduction today.  She had minimal pain there  with movement and with impingement maneuvers today.  She had some  tenderness with bicipital tendon palpation.  Sensory exam remained 1+/2  on the right side.  She has minimal central 7 on the right.  Cognitively  she is intact.  HEART:  Regular.  CHEST:  Clear.  ABDOMEN: Soft, nontender.   ASSESSMENT:  1. Left basal ganglia stroke.  2. Hypertension.  3. Diabetes.  4. Right rotator cuff syndrome.   PLAN:  1. Spasticity seems to be under fair control at this point.  Continue      Baclofen 10 mg b.i.d.  2. Discussed  passive and active exercises for her right upper      extremity.  She will be a bit limited due to the weakness she still      has but want her to pursue these nonetheless as she is at risk for      further capsulitis there.  3. I will see her back in six months.  Continue with diet and      excellent glycemic control.      Ranelle Oyster, M.D.  Electronically Signed     ZTS/MedQ  D:  09/30/2007 09:31:01  T:  09/30/2007 11:07:06  Job #:  [id number]   cc:   Molly Maduro A. Nicholos Johns, M.D.  Fax: [phone]

## 2010-11-18 NOTE — Consult Note (Signed)
[name]                ACCOUNT NO.:  [id number]   MEDICAL RECORD NO.:  [id number]          PATIENT TYPE:  INP   LOCATION:  3033                         FACILITY:  [facility]   PHYSICIAN:  Deanna Artis. Hickling, M.D.DATE OF BIRTH:  01/12/1956   DATE OF CONSULTATION:  01/14/2007  DATE OF DISCHARGE:                                 CONSULTATION   CHIEF COMPLAINT:  Can't move right side.Marland Kitchen   HISTORY OF THE PRESENT CONDITION:  [name] is a 55 year old woman  admitted to [facility] July 1-7 with a subcortical left brain  nonhemorrhagic infarction that involved the dorsal putamen and tail of  the caudate.  The patient had periventricular and subcortical white  matter changes bilaterally and a remote blowout fracture of her right  globe.  MRA showed mild intracranial atherosclerosis but no occlusive  disease.  2-D echocardiogram revealed an ejection fraction of 60,  abnormal left ventricular relaxation, left ventricular wall thickness  was mildly increased, no source of emboli.  No regional wall  abnormalities were seen.  Carotid Doppler showed no evident  hemodynamically significant internal carotid artery stenosis.   In addition, the patient had evaluation for risk factors, which showed  serum homocysteine normal at 6.1, fasting lipid profile total  cholesterol 266, LDL 196, HDL 31.  The patient was treated with Zocor.  She also had uncontrolled diabetes mellitus with a hemoglobin A1c  elevated at 13.8 and markedly high blood pressure that was 210/110 on  admission.  The patient had known medical problems and had been  noncompliant with her medication for over a year before her  presentation.   At discharge, the patient said that she was able to bear weight on her  leg and walk independently.  She was able lift her arm against gravity  but really could not use her arm to feed herself.  She had mild right  facial weakness.  She had some progression of her symptoms in  the  hospital but had stabilized.  Unfortunately, after discharge the patient  had further progression of her symptoms.  She was seen by her primary  physician, Dr. Azucena Cecil, on Tuesday and said that she was palpably weaker  and that she had difficulty lifting her arm and greater difficulty  bearing weight on her leg.  No further action was taken.  The patient  went home and over the course of the next 2 days lost the ability to use  her right arm and required contact assistance from her husband in order  to walk about her home from bedroom to kitchen to bathroom.   The patient, fortunately, had not had any falls.  She went to rehab and  was noted to have moderately significant weakness.  It is not clear to  me whether the outpatient rehab team knew that she had deteriorated.  Despite this, her husband decided to bring her in to the hospital to  determine why she seemed to be getting weaker.   Her risk factors for stroke include hypertension, insulin-dependent  diabetes mellitus, dyslipidemia, morbid obesity, a sedentary lifestyle,  and  medical noncompliance.   Her review of systems is remarkable for increasing weakness on the right  side without dysphagia.  She denies chest pain, palpitations.  No  respiratory distress or cough.  No nausea or vomiting.  No GI symptoms  in terms of dysuria, hematuria, urgency or frequency.  No  musculoskeletal pain.  She is postmenopausal.  No signs anemia or  bruisability.  No allergic or immunologic conditions.  No depression or  anxiety.  A 12-system review is otherwise negative.   FAMILY HISTORY:  Sister has diabetes.  Another sister died of breast  cancer.  She has a brother with congestive heart failure.  Her father's  health history is unknown.  Mother died young.  She does not know why.   SOCIAL HISTORY:  The patient is married.  She does not use alcohol nor  does she use tobacco or drugs.  The patient went home as a modified  Rankin 3 and is  now modified Rankin 4, being unable to walk with without  assistance.   Her current medications include:  1. Catapres TTS-2 patch weekly.  2. Lisinopril 20 mg daily.  3. Hydrochlorothiazide 25 mg daily.  4. Enteric-coated aspirin 325 mg daily.  5. Amaryl 2 mg daily.  6. Lantus insulin 25 units subcutaneously at nighttime.  7. Zocor 20 mg daily in the evening.   DRUG ALLERGIES:  None known.   EXAMINATION:  Temperature 92, blood pressure 116/75 resting pulse 78,  respirations 21, oxygen saturation 95%.  HEAD, EYES, EARS, NOSE AND THROAT:  No infection.  No bruits.  LUNGS:  Clear.  HEART:  No murmurs.  Pulses normal.  ABDOMEN:  Soft, protuberant.  Bowel sounds normal.  No  hepatosplenomegaly.  Extremities were normal.  NEUROLOGIC EXAMINATION:  The patient was awake, alert, attentive and  appropriate, mild dysarthria.  NIH Stroke Scale score was 5.  She does  not have dysphagia.  She has no dysarthria.  Cranial nerves:  Round,  reactive pupils, mild right central seventh.  She is able to protrude  her tongue and elevate her uvula in the midline.  Visual fields are  full.  Extraocular movements full.  Motor examination:  2/5 in the upper  extremity, 3/5 in the lower extremity.  She has actually a spastic 5/5  in her knee extensor and 4/5 in her psoas.  She is 0/5 in her foot  dorsiflexor and a spastic 5/5 in her foot plantar flexor.  The left side  is entirely normal.  The patient is 0/5 in her hand and wrist.  She does  not have a hemisensory deficit.  She actually has intact stereoagnosis  to fairly large objects on the right and much smaller objects on the  left.   Deep tendon reflexes are absent.  The patient had a right neutral  plantar response and a left flexor plantar response.   THE TEST RESULTS TO DATE:  Sodium 136, potassium 3.8, chloride 99, CO2  25, BUN 28, creatinine 1.03, glucose 110.  No other significant  abnormalities in the comprehensive metabolic panel.   Hemoglobin 15.3,  hematocrit 48.8, MCV 70.3,  white blood cell count 7600, 309,000  platelets, 55 polys, 34 lymphs, 9 monos, 1 eosinophil, 1 basophil.  PT  13.3, INR 1.0, PTT 27.  MRI of the brain shows progression of her  subcortical stroke.  It remains nonhemorrhagic and now involves a larger  area of the posterior limb of the internal capsule and the dorsal  putamen  and just slightly larger in the tail of the caudate.  Blood  vessels appear to be patent.  The screening MRA shows excellent patency  of all vessels and branch vessels in the circle of Willis in both the  anterior and posterior circulation.   IMPRESSION:  1. 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  2. Hypertension, in excellent control.  3. Diabetes mellitus, in greatly improved control.  4. Dyslipidemia.  5. Morbid obesity.   The patient now has a disabling stroke.  She will require inpatient  rehabilitation both for occupational and physical therapy.  She needs a  swallowing study.  We need to continue to treat risk factors, which  appear to be responding very nicely to treatment.  I do not believe that  she needs a repeat workup at this time.  I called upon the [facility] to admit the patient.      Deanna Artis. Sharene Skeans, M.D.  Electronically Signed     [facility]/MEDQ  D:  01/14/2007  T:  01/16/2007  Job:  [id number]   cc:   Tally Joe, M.D.

## 2010-11-18 NOTE — Assessment & Plan Note (Signed)
[name] is back regarding her left basal ganglia stroke.  She has been in  outpatient therapy working with PT and OT and doing quite well.  She is  still having some stiffness and swelling in the right upper extremity.  She has 2 splints crafted by OT to assist her in range of motion and  positioning.  Mood has been good.  She is on a new combination  medication for sugars and they have been under good control.  She is off  the Lantus now.  She is sleeping well.  She is independent with her  ambulation.  She still needs some assistance with dressing and bathing.   REVIEW OF SYSTEMS:  Notable for occasional tingling, numbness, weakness.  She has had some diarrhea.  Other pertinent positives listed above and  full review is in the written health and history section of the chart.   SOCIAL HISTORY:  The patient is married and living with her husband.   PHYSICAL EXAMINATION:  VITAL SIGNS:  Blood pressure is 107/69, pulse 72,  respiratory rate 18, she is sating 97% on room air.  GENERAL:  The patient is pleasant and oriented x3.  Affect is bright and  appropriate.  MUSCULOSKELETAL:  Strength improving in the right upper extremity at 2-3  plus out of 5.  She is a bit tight at the elbow and wrist again today.  I grade tone at the elbow at 1 plus to 2 out of 4.  Wrist tone is 1 to 1  plus out of 4.  Shoulder tone is 1 plus out of 4.  Sensation grossly  intact, though she still lacks some fine touch.  NEUROLOGIC:  Mild central 7 is resolving.  Cranial nerve exam otherwise  intact.  Cognitively she is appropriate.  Mood was good.  HEART:  Regular.  CHEST:  Clear.  ABDOMEN:  Soft, nontender.   ASSESSMENT:  1. Left basal ganglia stroke.  2. Hypertension.  3. Diabetes.   PLAN:  1. We will begin the patient on low dose Baclofen to see if we can      improve tone in the right upper extremity.  We discussed the facts      and side effects, begin 10 b.i.d. moving up to t.i.d. after one      week's  time.  2. Continue splinting and range of motion as she is doing.  3. Overall, she is doing very nicely.  4. I will see her back in about 6 weeks' time.      Ranelle Oyster, M.D.  Electronically Signed     ZTS/Med[facility]  D:  05/27/2007 09:37:55  T:  05/27/2007 14:00:59  Job #:  [id number]   cc:   Molly Maduro A. Nicholos Johns, M.D.  Fax: [phone]

## 2011-03-18 ENCOUNTER — Other Ambulatory Visit (HOSPITAL_COMMUNITY): Payer: Self-pay | Admitting: Internal Medicine

## 2011-03-18 DIAGNOSIS — Z1231 Encounter for screening mammogram for malignant neoplasm of breast: Secondary | ICD-10-CM

## 2011-03-27 ENCOUNTER — Ambulatory Visit (HOSPITAL_COMMUNITY)
Admission: RE | Admit: 2011-03-27 | Discharge: 2011-03-27 | Disposition: A | Discharge status: Home / Self Care | Payer: Medicare Other | Source: Ambulatory Visit | Attending: Internal Medicine | Admitting: Internal Medicine

## 2011-03-27 DIAGNOSIS — Z1231 Encounter for screening mammogram for malignant neoplasm of breast: Secondary | ICD-10-CM

## 2011-04-20 LAB — COMPREHENSIVE METABOLIC PANEL
ALT: 22
AST: 21
Albumin: 3.3 — ABNORMAL LOW
Alkaline Phosphatase: 69
BUN: 30 — ABNORMAL HIGH
Calcium: 9.4
Chloride: 107
Creatinine, Ser: 1.11
Potassium: 4.1

## 2011-04-20 LAB — DIFFERENTIAL
Basophils Absolute: 0
Lymphocytes Relative: 44
Lymphs Abs: 2.9
Neutrophils Relative %: 46

## 2011-04-20 LAB — CBC
HCT: 42.8
MCHC: 31.3
MCV: 71.1 — ABNORMAL LOW

## 2011-04-21 LAB — CBC
HCT: 46.3 — ABNORMAL HIGH
Hemoglobin: 14.4
Hemoglobin: 15.3 — ABNORMAL HIGH
MCHC: 31
MCHC: 31.3
MCV: 70.3 — ABNORMAL LOW
MCV: 70.3 — ABNORMAL LOW
Platelets: 256
RBC: 6.58 — ABNORMAL HIGH
RBC: 6.95 — ABNORMAL HIGH
RDW: 14.8 — ABNORMAL HIGH
RDW: 15.1 — ABNORMAL HIGH
WBC: 6.9

## 2011-04-21 LAB — BASIC METABOLIC PANEL
BUN: 7
BUN: 9
CO2: 25
CO2: 27
Chloride: 101
Chloride: 103
Chloride: 105
Creatinine, Ser: 0.63
Creatinine, Ser: 0.74
GFR calc Af Amer: >60
GFR calc non Af Amer: >60
Glucose, Bld: 150 — ABNORMAL HIGH
Glucose, Bld: 217 — ABNORMAL HIGH
Potassium: 3.8
Potassium: 3.9
Sodium: 136
Sodium: 140

## 2011-04-21 LAB — URINALYSIS, ROUTINE W REFLEX MICROSCOPIC
Bilirubin Urine: NEGATIVE
Glucose, UA: >1000 — AB
Hgb urine dipstick: NEGATIVE
Ketones, ur: NEGATIVE
Leukocytes, UA: NEGATIVE
Nitrite: NEGATIVE
Nitrite: NEGATIVE
Protein, ur: 100 — AB
Protein, ur: NEGATIVE
Specific Gravity, Urine: 1.02
Urobilinogen, UA: 0.2
Urobilinogen, UA: 0.2
pH: 6.5

## 2011-04-21 LAB — LIPID PANEL
Cholesterol: 266 — ABNORMAL HIGH
LDL Cholesterol: 196 — ABNORMAL HIGH
Triglycerides: 194 — ABNORMAL HIGH
VLDL: 39

## 2011-04-21 LAB — COMPREHENSIVE METABOLIC PANEL
AST: 17
Albumin: 3.6
Alkaline Phosphatase: 109
Alkaline Phosphatase: 88
BUN: 28 — ABNORMAL HIGH
BUN: 6
CO2: 25
CO2: 27
Chloride: 101
Chloride: 99
Creatinine, Ser: 0.53
GFR calc Af Amer: >60
GFR calc non Af Amer: 56 — ABNORMAL LOW
GFR calc non Af Amer: >60
Glucose, Bld: 110 — ABNORMAL HIGH
Potassium: 3.1 — ABNORMAL LOW
Potassium: 3.8
Total Bilirubin: 0.5
Total Bilirubin: 0.7

## 2011-04-21 LAB — DIFFERENTIAL
Basophils Absolute: 0
Basophils Absolute: 0.1
Basophils Relative: 1
Basophils Relative: 1
Eosinophils Absolute: 0.1
Eosinophils Absolute: 0.2
Eosinophils Relative: 2
Lymphocytes Relative: 36
Lymphs Abs: 2.4
Monocytes Absolute: 0.4
Monocytes Absolute: 0.6
Monocytes Relative: 6
Monocytes Relative: 9
Neutro Abs: 3.8
Neutrophils Relative %: 55

## 2011-04-21 LAB — APTT: aPTT: 25

## 2011-04-21 LAB — COMPREHENSIVE METABOLIC PANEL WITH GFR
ALT: 18
Calcium: 9
Glucose, Bld: 284 — ABNORMAL HIGH
Sodium: 135
Total Protein: 7.6

## 2011-04-21 LAB — PROTIME-INR
INR: 0.9
Prothrombin Time: 12.5

## 2011-04-21 LAB — URINE MICROSCOPIC-ADD ON

## 2011-04-21 LAB — HEMOGLOBIN A1C: Hgb A1c MFr Bld: 13.8 — ABNORMAL HIGH

## 2011-12-31 ENCOUNTER — Encounter (HOSPITAL_COMMUNITY): Payer: Self-pay

## 2011-12-31 ENCOUNTER — Emergency Department (HOSPITAL_COMMUNITY)
Admission: EM | Admit: 2011-12-31 | Discharge: 2011-12-31 | Disposition: A | Discharge status: Home / Self Care | Payer: Medicare Other | Source: Home / Self Care | Attending: Family Medicine | Admitting: Family Medicine

## 2011-12-31 DIAGNOSIS — L988 Other specified disorders of the skin and subcutaneous tissue: Secondary | ICD-10-CM

## 2011-12-31 DIAGNOSIS — L959 Vasculitis limited to the skin, unspecified: Secondary | ICD-10-CM

## 2011-12-31 HISTORY — DX: Essential (primary) hypertension: I10

## 2011-12-31 HISTORY — DX: Pure hypercholesterolemia, unspecified: E78.00

## 2011-12-31 HISTORY — DX: Cerebral infarction, unspecified: I63.9

## 2011-12-31 MED ORDER — IBUPROFEN 600 MG PO TABS: ORAL_TABLET | ORAL | Status: AC

## 2011-12-31 MED ORDER — FLUTICASONE PROPIONATE 0.05 % EX CREA: TOPICAL_CREAM | Freq: Two times a day (BID) | CUTANEOUS | Status: AC

## 2011-12-31 NOTE — Discharge Instructions (Signed)
Elevated your feet above your waist as much as possible. Apply the cream sparingly. Follow up with your pcp or return if no change or symptoms worsen.

## 2011-12-31 NOTE — ED Provider Notes (Signed)
History     CSN: [id number]  Arrival date & time 12/31/11  1215   First MD Initiated Contact with Patient 12/31/11 1243      Chief Complaint  Patient presents with  . Rash    (Consider location/radiation/quality/duration/timing/severity/associated sxs/prior treatment) HPI Comments: The patient reports having 2 sores on her lower right leg above the ankle about a month ago. These areas completely resolved. About a wk ago she noted itching in the area and scratched it . Now with large red area. Still itches. No pain. No blistering. No increased warmth. States she has been applying neosporin.   The history is provided by the patient.    Past Medical History  Diagnosis Date  . Hypertension   . Diabetes mellitus   . Stroke   . High cholesterol     History reviewed. No pertinent past surgical history.  History reviewed. No pertinent family history.  History  Substance Use Topics  . Smoking status: Never Smoker   . Smokeless tobacco: Not on file  . Alcohol Use: No    OB History    Grav Para Term Preterm Abortions TAB SAB Ect Mult Living                  Review of Systems  Constitutional: Negative.   HENT: Negative.   Respiratory: Negative.   Cardiovascular: Negative.   Genitourinary: Negative.     Allergies  Review of patient's allergies indicates no known allergies.  Home Medications   Current Outpatient Rx  Name Route Sig Dispense Refill  . ASPIRIN PO Oral Take by mouth.    . AGGRENOX PO Oral Take by mouth 2 (two) times daily.    Marland Kitchen GLUCOTROL PO Oral Take by mouth daily.    . IRON PO Oral Take by mouth.    Marland Kitchen LISINOPRIL-HYDROCHLOROTHIAZIDE PO Oral Take by mouth daily.    . CRESTOR PO Oral Take by mouth daily.    Marland Kitchen JANUMET PO Oral Take by mouth 2 (two) times daily.    Marland Kitchen FLUTICASONE PROPIONATE 0.05 % EX CREA Topical Apply topically 2 (two) times daily. 30 g 0  . IBUPROFEN 600 MG PO TABS  Take on with food three times daily until gone 30 tablet 0    BP  143/75  Pulse 75  Temp 98.3 F (36.8 C) (Oral)  Resp 18  SpO2 100%  Physical Exam  Nursing note and vitals reviewed. Constitutional: She appears well-developed and well-nourished. No distress.  HENT:  Head: Normocephalic and atraumatic.  Neck: Neck supple.  Cardiovascular: Normal rate, regular rhythm and normal heart sounds.   Pulmonary/Chest: Effort normal and breath sounds normal. No respiratory distress.  Skin: Skin is warm and dry.       Area anterior right lower extremity above the ankle that is red. No increased warmth. No blanching. No blisters, no pustules. Skin is intact. No pain to palpation.     ED Course  Procedures (including critical care time)  Labs Reviewed - No data to display No results found.   1. Vasculitis limited to skin       MDM          Randa Spike, MD 12/31/11 1359

## 2011-12-31 NOTE — ED Notes (Signed)
Pt states she had 2 bumps to rt lower leg 1 month ago- states her PCP told her to apply neosporin and these areas resolved.  Reports area began itching on Thursday and subsequently became red.  Sx getting worse.

## 2012-04-12 ENCOUNTER — Other Ambulatory Visit (HOSPITAL_COMMUNITY): Payer: Self-pay | Admitting: Internal Medicine

## 2012-04-12 DIAGNOSIS — Z1231 Encounter for screening mammogram for malignant neoplasm of breast: Secondary | ICD-10-CM

## 2012-04-29 ENCOUNTER — Ambulatory Visit (HOSPITAL_COMMUNITY)
Admission: RE | Admit: 2012-04-29 | Discharge: 2012-04-29 | Disposition: A | Discharge status: Home / Self Care | Payer: Medicare Other | Source: Ambulatory Visit | Attending: Internal Medicine | Admitting: Internal Medicine

## 2012-04-29 DIAGNOSIS — Z1231 Encounter for screening mammogram for malignant neoplasm of breast: Secondary | ICD-10-CM

## 2013-09-19 ENCOUNTER — Emergency Department (HOSPITAL_COMMUNITY)
Admission: EM | Admit: 2013-09-19 | Discharge: 2013-09-19 | Disposition: A | Discharge status: Home / Self Care | Payer: Medicare Other | Source: Home / Self Care

## 2013-09-19 ENCOUNTER — Encounter (HOSPITAL_COMMUNITY): Payer: Self-pay | Admitting: Emergency Medicine

## 2013-09-19 DIAGNOSIS — E119 Type 2 diabetes mellitus without complications: Secondary | ICD-10-CM

## 2013-09-19 DIAGNOSIS — B373 Candidiasis of vulva and vagina: Secondary | ICD-10-CM

## 2013-09-19 DIAGNOSIS — B372 Candidiasis of skin and nail: Secondary | ICD-10-CM

## 2013-09-19 DIAGNOSIS — N760 Acute vaginitis: Secondary | ICD-10-CM

## 2013-09-19 DIAGNOSIS — R350 Frequency of micturition: Secondary | ICD-10-CM

## 2013-09-19 DIAGNOSIS — R7309 Other abnormal glucose: Secondary | ICD-10-CM

## 2013-09-19 DIAGNOSIS — B3731 Acute candidiasis of vulva and vagina: Secondary | ICD-10-CM

## 2013-09-19 DIAGNOSIS — N764 Abscess of vulva: Secondary | ICD-10-CM

## 2013-09-19 DIAGNOSIS — R739 Hyperglycemia, unspecified: Secondary | ICD-10-CM

## 2013-09-19 LAB — POCT URINALYSIS DIP (DEVICE)
BILIRUBIN URINE: NEGATIVE
GLUCOSE, UA: 500 mg/dL — AB
Nitrite: NEGATIVE
PROTEIN: 100 mg/dL — AB
Urobilinogen, UA: 0.2 mg/dL (ref 0.0–1.0)
pH: 5 (ref 5.0–8.0)

## 2013-09-19 LAB — POCT I-STAT, CHEM 8
BUN: 16 mg/dL (ref 6–23)
CALCIUM ION: 1.11 mmol/L — AB (ref 1.13–1.30)
CREATININE: 0.9 mg/dL (ref 0.50–1.10)
Chloride: 98 mEq/L (ref 96–112)
GLUCOSE: 386 mg/dL — AB (ref 70–99)
HEMATOCRIT: 46 % (ref 36.0–46.0)
HEMOGLOBIN: 15.6 g/dL — AB (ref 12.0–15.0)
POTASSIUM: 3.8 meq/L (ref 3.7–5.3)
Sodium: 137 mEq/L (ref 137–147)
TCO2: 30 mmol/L (ref 0–100)

## 2013-09-19 MED ORDER — FLUCONAZOLE 150 MG PO TABS: ORAL_TABLET | ORAL | Status: AC

## 2013-09-19 MED ORDER — SULFAMETHOXAZOLE-TRIMETHOPRIM 800-160 MG PO TABS: 1.0000 | ORAL_TABLET | Freq: Two times a day (BID) | ORAL | Status: AC

## 2013-09-19 MED ORDER — NYSTATIN 100000 UNIT/GM EX OINT: 1.0000 "application " | TOPICAL_OINTMENT | Freq: Two times a day (BID) | CUTANEOUS | Status: AC

## 2013-09-19 NOTE — ED Notes (Signed)
Uti: frequent urination, irritated skin

## 2013-09-19 NOTE — ED Provider Notes (Signed)
CSN: [id number]     Arrival date & time 09/19/13  [id number] History   First MD Initiated Contact with Patient 09/19/13 [phone]     Chief Complaint  Patient presents with  . Urinary Tract Infection   (Consider location/radiation/quality/duration/timing/severity/associated sxs/prior Treatment) HPI Comments:  58 year old female with a history of hypertension and type 2 diabetes mellitus presents with a complaint of urinary frequency for one month. It is associated with urgency and burning with urination. She is having small volume voids frequently. He is also complaining of vulvar "rawness" and swelling associated with a pink wetness in the area.  She has had increased thirst as well. She notes that she has not been taking her obese medications "as she should".   Past Medical History  Diagnosis Date  . Hypertension   . Diabetes mellitus   . Stroke   . High cholesterol    History reviewed. No pertinent past surgical history. No family history on file. History  Substance Use Topics  . Smoking status: Never Smoker   . Smokeless tobacco: Not on file  . Alcohol Use: No   OB History   Grav Para Term Preterm Abortions TAB SAB Ect Mult Living                 Review of Systems  Constitutional: Positive for activity change. Negative for fever.  HENT: Negative.   Respiratory: Negative.   Cardiovascular: Negative.   Gastrointestinal: Negative for vomiting, abdominal pain and diarrhea.  Genitourinary: Positive for dysuria, urgency, frequency, hematuria, decreased urine volume, difficulty urinating and vaginal pain. Negative for flank pain and pelvic pain.  Skin: Negative.   Neurological: Negative.     Allergies  Review of patient's allergies indicates no known allergies.  Home Medications   Current Outpatient Rx  Name  Route  Sig  Dispense  Refill  . ASPIRIN PO   Oral   Take by mouth.         . Aspirin-Dipyridamole (AGGRENOX PO)   Oral   Take by mouth 2 (two) times daily.          . GlipiZIDE (GLUCOTROL PO)   Oral   Take by mouth daily.         Marland Kitchen. ibuprofen (ADVIL,MOTRIN) 600 MG tablet      Take on with food three times daily until gone   30 tablet   0   . IRON PO   Oral   Take by mouth.         Marland Kitchen. LISINOPRIL-HYDROCHLOROTHIAZIDE PO   Oral   Take by mouth daily.         . Rosuvastatin Calcium (CRESTOR PO)   Oral   Take by mouth daily.         . SitaGLIPtin-MetFORMIN HCl (JANUMET PO)   Oral   Take by mouth 2 (two) times daily.         . fluconazole (DIFLUCAN) 150 MG tablet      Take 1 tablet every other day for 3 doses   3 tablet   0   . nystatin ointment (MYCOSTATIN)   Topical   Apply 1 application topically 2 (two) times daily.   30 g   0   . sulfamethoxazole-trimethoprim (BACTRIM DS,SEPTRA DS) 800-160 MG per tablet   Oral   Take 1 tablet by mouth 2 (two) times daily.   14 tablet   0    BP 162/94  Pulse 72  Temp(Src) 98.4 F (36.9 C) (Oral)  Resp 18  SpO2 99% Physical Exam  Constitutional: She is oriented to person, place, and time. She appears well-developed and well-nourished. No distress.  Neck: Normal range of motion. Neck supple.  Cardiovascular: Normal rate and regular rhythm.   Pulmonary/Chest: Effort normal. No respiratory distress. She has no wheezes.  Abdominal: Soft. There is no tenderness.  Genitourinary: No vaginal discharge found.  Right major labial swelling, redness and tenderness and induration. 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 Bilat vulvar erythema with well marginated border along the gluteal cleft, perineum and proximal thigh.   Neurological: She is alert and oriented to person, place, and time.  Skin: Skin is warm and dry.  Psychiatric: She has a normal mood and affect.    ED Course  Procedures (including critical care time) Labs Review Labs Reviewed  POCT URINALYSIS DIP (DEVICE) - Abnormal; Notable  for the following:    Glucose, UA 500 (*)    Ketones, ur TRACE (*)    Hgb urine dipstick SMALL (*)    Protein, ur 100 (*)    Leukocytes, UA TRACE (*)    All other components within normal limits  POCT I-STAT, CHEM 8 - Abnormal; Notable for the following:    Glucose, Bld 386 (*)    Calcium, Ion 1.11 (*)    Hemoglobin 15.6 (*)    All other components within normal limits  CULTURE, ROUTINE-ABSCESS   Imaging Review No results found. Results for orders placed during the hospital encounter of 09/19/13  POCT URINALYSIS DIP (DEVICE)      Result Value Ref Range   Glucose, UA 500 (*) NEGATIVE mg/dL   Bilirubin Urine NEGATIVE  NEGATIVE   Ketones, ur TRACE (*) NEGATIVE mg/dL   Specific Gravity, Urine >=1.030  1.005 - 1.030   Hgb urine dipstick SMALL (*) NEGATIVE   pH 5.0  5.0 - 8.0   Protein, ur 100 (*) NEGATIVE mg/dL   Urobilinogen, UA 0.2  0.0 - 1.0 mg/dL   Nitrite NEGATIVE  NEGATIVE   Leukocytes, UA TRACE (*) NEGATIVE  POCT I-STAT, CHEM 8      Result Value Ref Range   Sodium 137  137 - 147 mEq/L   Potassium 3.8  3.7 - 5.3 mEq/L   Chloride 98  96 - 112 mEq/L   BUN 16  6 - 23 mg/dL   Creatinine, Ser 1.61  0.50 - 1.10 mg/dL   Glucose, Bld 096 (*) 70 - 99 mg/dL   Calcium, Ion 0.45 (*) 1.13 - 1.30 mmol/L   TCO2 30  0 - 100 mmol/L   Hemoglobin 15.6 (*) 12.0 - 15.0 g/dL   HCT 40.9  81.1 - 91.4 %     MDM   1. Vulvovaginitis   2. Candida vaginitis   3. Candidiasis, intertrigo   4. Abscess of right genital labia   5. Urinary frequency   6. T2DM (type 2 diabetes mellitus)   7. Hyperglycemia     The dysuria is likely due to surrounding external inflammation. Frequency due to hyperglycemia.  Tx with topical nystatin to the red areas, not the introitus.  Diflucan 150 as dir Septra ds x 14 d. Warm compresses frequently. If not healing in 2-3 days or worse return for possible I and D.      Hayden Rasmussen, NP 09/19/13 1052

## 2013-09-19 NOTE — Discharge Instructions (Signed)
Abscess An abscess is an infected area that contains a collection of pus and debris.It can occur in almost any part of the body. An abscess is also known as a furuncle or boil. CAUSES  An abscess occurs when tissue gets infected. This can occur from blockage of oil or sweat glands, infection of hair follicles, or a minor injury to the skin. As the body tries to fight the infection, pus collects in the area and creates pressure under the skin. This pressure causes pain. People with weakened immune systems have difficulty fighting infections and get certain abscesses more often.  SYMPTOMS Usually an abscess develops on the skin and becomes a painful mass that is red, warm, and tender. If the abscess forms under the skin, you may feel a moveable soft area under the skin. Some abscesses break open (rupture) on their own, but most will continue to get worse without care. The infection can spread deeper into the body and eventually into the bloodstream, causing you to feel ill.  DIAGNOSIS  Your caregiver will take your medical history and perform a physical exam. A sample of fluid may also be taken from the abscess to determine what is causing your infection. TREATMENT  Your caregiver may prescribe antibiotic medicines to fight the infection. However, taking antibiotics alone usually does not cure an abscess. Your caregiver may need to make a small cut (incision) in the abscess to drain the pus. In some cases, gauze is packed into the abscess to reduce pain and to continue draining the area. HOME CARE INSTRUCTIONS   Only take over-the-counter or prescription medicines for pain, discomfort, or fever as directed by your caregiver.  If you were prescribed antibiotics, take them as directed. Finish them even if you start to feel better.  If gauze is used, follow your caregiver's directions for changing the gauze.  To avoid spreading the infection:  Keep your draining abscess covered with a  bandage.  Wash your hands well.  Do not share personal care items, towels, or whirlpools with others.  Avoid skin contact with others.  Keep your skin and clothes clean around the abscess.  Keep all follow-up appointments as directed by your caregiver. SEEK MEDICAL CARE IF:   You have increased pain, swelling, redness, fluid drainage, or bleeding.  You have muscle aches, chills, or a general ill feeling.  You have a fever. MAKE SURE YOU:   Understand these instructions.  Will watch your condition.  Will get help right away if you are not doing well or get worse. Document Released: 04/01/2005 Document Revised: 12/22/2011 Document Reviewed: 09/04/2011 [facility] Patient Information 2014 [place], [place].  Abscess An abscess (boil or furuncle) is an infected area on or under the skin. This area is filled with yellowish-white fluid (pus) and other material (debris). HOME CARE   Only take medicines as told by your doctor.  If you were given antibiotic medicine, take it as directed. Finish the medicine even if you start to feel better.  If gauze is used, follow your doctor's directions for changing the gauze.  To avoid spreading the infection:  Keep your abscess covered with a bandage.  Wash your hands well.  Do not share personal care items, towels, or whirlpools with others.  Avoid skin contact with others.  Keep your skin and clothes clean around the abscess.  Keep all doctor visits as told. GET HELP RIGHT AWAY IF:   You have more pain, puffiness (swelling), or redness in the wound site.  You  have more fluid or blood coming from the wound site.  You have muscle aches, chills, or you feel sick.  You have a fever. MAKE SURE YOU:   Understand these instructions.  Will watch your condition.  Will get help right away if you are not doing well or get worse. Document Released: 12/09/2007 Document Revised: 12/22/2011 Document Reviewed: 09/04/2011 [facility]  Patient Information 2014 [place], [place].  Blood Glucose Monitoring, Adult Monitoring your blood glucose (also know as blood sugar) helps you to manage your diabetes. It also helps you and your health care provider monitor your diabetes and determine how well your treatment plan is working. WHY SHOULD YOU MONITOR YOUR BLOOD GLUCOSE?  It can help you understand how food, exercise, and medicine affect your blood glucose.  It allows you to know what your blood glucose is at any given moment. You can quickly tell if you are having low blood glucose (hypoglycemia) or high blood glucose (hyperglycemia).  It can help you and your health care provider know how to adjust your medicines.  It can help you understand how to manage an illness or adjust medicine for exercise. WHEN SHOULD YOU TEST? Your health care provider will help you decide how often you should check your blood glucose. This may depend on the type of diabetes you have, your diabetes control, or the types of medicines you are taking. Be sure to write down all of your blood glucose readings so that this information can be reviewed with your health care provider. See below for examples of testing times that your health care provider may suggest. Type 1 Diabetes  Test 4 times a day if you are in good control, using an insulin pump, or perform multiple daily injections.  If your diabetes is not well-controlled or if you are sick, you may need to monitor more often.  It is a good idea to also monitor:  Before and after exercise.  Between meals and 2 hours after a meal.  Occasionally between 2:00 to 3:00 am. Type 2 Diabetes  It can vary with each person, but generally, if you are on insulin, test 4 times a day.  If you take medicines by mouth (orally), test 2 times a day.  If you are on a controlled diet, test once a day.  If your diabetes is not well controlled or if you are sick, you may need to monitor more often. HOW TO MONITOR  YOUR BLOOD GLUCOSE Supplies Needed  Blood glucose meter.  Test strips for your meter. Each meter has its own strips. You must use the strips that go with your own meter.  A pricking needle (lancet).  A device that holds the lancet (lancing device).  A journal or log book to write down your results. Procedure  Wash your hands with soap and water. Alcohol is not preferred.  Prick the side of your finger (not the tip) with the lancet.  Gently milk the finger until a small drop of blood appears.  Follow the instructions that come with your meter for inserting the test strip, applying blood to the strip, and using your blood glucose meter. Other Areas to Get Blood for Testing Some meters allow you to use other areas of your body (other than your finger) to test your blood. These areas are called alternative sites. The most common alternative sites are:  The forearm.  The thigh.  The back area of the lower leg.  The palm of the hand. The blood flow in  these areas is slower. Therefore, the blood glucose values you get may be delayed, and the numbers are different from what you would get from your fingers. Do not use alternative sites if you think you are having hypoglycemia. Your reading will not be accurate. Always use a finger if you are having hypoglycemia. Also, if you cannot feel your lows (hypoglycemia unawareness), always use your fingers for your blood glucose checks. ADDITIONAL TIPS FOR GLUCOSE MONITORING  Do not reuse lancets.  Always carry your supplies with you.  All blood glucose meters have a 24-hour "hotline" number to call if you have questions or need help.  Adjust (calibrate) your blood glucose meter with a control solution after finishing a few boxes of strips. BLOOD GLUCOSE RECORD KEEPING It is a good idea to keep a daily record or log of your blood glucose readings. Most glucose meters, if not all, keep your glucose records stored in the meter. Some meters come  with the ability to download your records to your home computer. Keeping a record of your blood glucose readings is especially helpful if you are wanting to look for patterns. Make notes to go along with the blood glucose readings because you might forget what happened at that exact time. Keeping good records helps you and your health care provider to work together to achieve good diabetes management.  Document Released: 06/25/2003 Document Revised: 02/22/2013 Document Reviewed: 11/14/2012 [facility] Patient Information 2014 [place], [place].  Candidal Vulvovaginitis Candidal vulvovaginitis is an infection of the vagina and vulva. The vulva is the skin around the opening of the vagina. This may cause itching and discomfort in and around the vagina.  HOME CARE  Only take medicine as told by your doctor.  Do not have sex (intercourse) until the infection is healed or as told by your doctor.  Practice safe sex.  Tell your sex partner about your infection.  Do not douche or use tampons.  Wear cotton underwear. Do not wear tight pants or panty hose.  Eat yogurt. This may help treat and prevent yeast infections. GET HELP RIGHT AWAY IF:   You have a fever.  Your problems get worse during treatment or do not get better in 3 days.  You have discomfort, irritation, or itching in your vagina or vulva area.  You have pain after sex.  You start to get belly (abdominal) pain. MAKE SURE YOU:  Understand these instructions.  Will watch your condition.  Will get help right away if you are not doing well or get worse. Document Released: 09/18/2008 Document Revised: 09/14/2011 Document Reviewed: 09/18/2008 [facility] Patient Information 2014 [place], [place].  Candida Infection, Adult A candida infection (also called yeast, fungus and Monilia infection) is an overgrowth of yeast that can occur anywhere on the body. A yeast infection commonly occurs in warm, moist body areas. Usually, the infection  remains localized but can spread to become a systemic infection. A yeast infection may be a sign of a more severe disease such as diabetes, leukemia, or AIDS. A yeast infection can occur in both men and women. In women, Candida vaginitis is a vaginal infection. It is one of the most common causes of vaginitis. Men usually do not have symptoms or know they have an infection until other problems develop. Men may find out they have a yeast infection because their sex partner has a yeast infection. Uncircumcised men are more likely to get a yeast infection than circumcised men. This is because the uncircumcised glans is not  exposed to air and does not remain as dry as that of a circumcised glans. Older adults may develop yeast infections around dentures. CAUSES  Women  Antibiotics.  Steroid medication taken for a long time.  Being overweight (obese).  Diabetes.  Poor immune condition.  Certain serious medical conditions.  Immune suppressive medications for organ transplant patients.  Chemotherapy.  Pregnancy.  Menstration.  Stress and fatigue.  Intravenous drug use.  Oral contraceptives.  Wearing tight-fitting clothes in the crotch area.  Catching it from a sex partner who has a yeast infection.  Spermicide.  Intravenous, urinary, or other catheters. Men  Catching it from a sex partner who has a yeast infection.  Having oral or anal sex with a person who has the infection.  Spermicide.  Diabetes.  Antibiotics.  Poor immune system.  Medications that suppress the immune system.  Intravenous drug use.  Intravenous, urinary, or other catheters. SYMPTOMS  Women  Thick, white vaginal discharge.  Vaginal itching.  Redness and swelling in and around the vagina.  Irritation of the lips of the vagina and perineum.  Blisters on the vaginal lips and perineum.  Painful sexual intercourse.  Low blood sugar (hypoglycemia).  Painful urination.  Bladder  infections.  Intestinal problems such as constipation, indigestion, bad breath, bloating, increase in gas, diarrhea, or loose stools. Men  Men may develop intestinal problems such as constipation, indigestion, bad breath, bloating, increase in gas, diarrhea, or loose stools.  Dry, cracked skin on the penis with itching or discomfort.  Jock itch.  Dry, flaky skin.  Athlete's foot.  Hypoglycemia. DIAGNOSIS  Women  A history and an exam are performed.  The discharge may be examined under a microscope.  A culture may be taken of the discharge. Men  A history and an exam are performed.  Any discharge from the penis or areas of cracked skin will be looked at under the microscope and cultured.  Stool samples may be cultured. TREATMENT  Women  Vaginal antifungal suppositories and creams.  Medicated creams to decrease irritation and itching on the outside of the vagina.  Warm compresses to the perineal area to decrease swelling and discomfort.  Oral antifungal medications.  Medicated vaginal suppositories or cream for repeated or recurrent infections.  Wash and dry the irritation areas before applying the cream.  Eating yogurt with lactobacillus may help with prevention and treatment.  Sometimes painting the vagina with gentian violet solution may help if creams and suppositories do not work. Men  Antifungal creams and oral antifungal medications.  Sometimes treatment must continue for 30 days after the symptoms go away to prevent recurrence. HOME CARE INSTRUCTIONS  Women  Use cotton underwear and avoid tight-fitting clothing.  Avoid colored, scented toilet paper and deodorant tampons or pads.  Do not douche.  Keep your diabetes under control.  Finish all the prescribed medications.  Keep your skin clean and dry.  Consume milk or yogurt with lactobacillus active culture regularly. If you get frequent yeast infections and think that is what the infection is,  there are over-the-counter medications that you can get. If the infection does not show healing in 3 days, talk to your caregiver.  Tell your sex partner you have a yeast infection. Your partner may need treatment also, especially if your infection does not clear up or recurs. Men  Keep your skin clean and dry.  Keep your diabetes under control.  Finish all prescribed medications.  Tell your sex partner that you have a yeast infection  so they can be treated if necessary. SEEK MEDICAL CARE IF:   Your symptoms do not clear up or worsen in one week after treatment.  You have an oral temperature above 102 F (38.9 C).  You have trouble swallowing or eating for a prolonged time.  You develop blisters on and around your vagina.  You develop vaginal bleeding and it is not your menstrual period.  You develop abdominal pain.  You develop intestinal problems as mentioned above.  You get weak or lightheaded.  You have painful or increased urination.  You have pain during sexual intercourse. MAKE SURE YOU:   Understand these instructions.  Will watch your condition.  Will get help right away if you are not doing well or get worse. Document Released: 07/30/2004 Document Revised: 09/14/2011 Document Reviewed: 11/11/2009 [facility] Patient Information 2014 [place], [place].  Hyperglycemia Hyperglycemia occurs when the glucose (sugar) in your blood is too high. Hyperglycemia can happen for many reasons, but it most often happens to people who do not know they have diabetes or are not managing their diabetes properly.  CAUSES  Whether you have diabetes or not, there are other causes of hyperglycemia. Hyperglycemia can occur when you have diabetes, but it can also occur in other situations that you might not be as aware of, such as: Diabetes  If you have diabetes and are having problems controlling your blood glucose, hyperglycemia could occur because of some of the following  reasons:  Not following your meal plan.  Not taking your diabetes medications or not taking it properly.  Exercising less or doing less activity than you normally do.  Being sick. Pre-diabetes  This cannot be ignored. Before people develop Type 2 diabetes, they almost always have "pre-diabetes." This is when your blood glucose levels are higher than normal, but not yet high enough to be diagnosed as diabetes. Research has shown that some long-term damage to the body, especially the heart and circulatory system, may already be occurring during pre-diabetes. If you take action to manage your blood glucose when you have pre-diabetes, you may delay or prevent Type 2 diabetes from developing. Stress  If you have diabetes, you may be "diet" controlled or on oral medications or insulin to control your diabetes. However, you may find that your blood glucose is higher than usual in the hospital whether you have diabetes or not. This is often referred to as "stress hyperglycemia." Stress can elevate your blood glucose. This happens because of hormones put out by the body during times of stress. If stress has been the cause of your high blood glucose, it can be followed regularly by your caregiver. That way he/she can make sure your hyperglycemia does not continue to get worse or progress to diabetes. Steroids  Steroids are medications that act on the infection fighting system (immune system) to block inflammation or infection. One side effect can be a rise in blood glucose. Most people can produce enough extra insulin to allow for this rise, but for those who cannot, steroids make blood glucose levels go even higher. It is not unusual for steroid treatments to "uncover" diabetes that is developing. It is not always possible to determine if the hyperglycemia will go away after the steroids are stopped. A special blood test called an A1c is sometimes done to determine if your blood glucose was elevated before  the steroids were started. SYMPTOMS  Thirsty.  Frequent urination.  Dry mouth.  Blurred vision.  Tired or fatigue.  Weakness.  Sleepy.  Tingling in feet or leg. DIAGNOSIS  Diagnosis is made by monitoring blood glucose in one or all of the following ways:  A1c test. This is a chemical found in your blood.  Fingerstick blood glucose monitoring.  Laboratory results. TREATMENT  First, knowing the cause of the hyperglycemia is important before the hyperglycemia can be treated. Treatment may include, but is not be limited to:  Education.  Change or adjustment in medications.  Change or adjustment in meal plan.  Treatment for an illness, infection, etc.  More frequent blood glucose monitoring.  Change in exercise plan.  Decreasing or stopping steroids.  Lifestyle changes. HOME CARE INSTRUCTIONS   Test your blood glucose as directed.  Exercise regularly. Your caregiver will give you instructions about exercise. Pre-diabetes or diabetes which comes on with stress is helped by exercising.  Eat wholesome, balanced meals. Eat often and at regular, fixed times. Your caregiver or nutritionist will give you a meal plan to guide your sugar intake.  Being at an ideal weight is important. If needed, losing as little as 10 to 15 pounds may help improve blood glucose levels. SEEK MEDICAL CARE IF:   You have questions about medicine, activity, or diet.  You continue to have symptoms (problems such as increased thirst, urination, or weight gain). SEEK IMMEDIATE MEDICAL CARE IF:   You are vomiting or have diarrhea.  Your breath smells fruity.  You are breathing faster or slower.  You are very sleepy or incoherent.  You have numbness, tingling, or pain in your feet or hands.  You have chest pain.  Your symptoms get worse even though you have been following your caregiver's orders.  If you have any other questions or concerns. Document Released: 12/16/2000 Document  Revised: 09/14/2011 Document Reviewed: 10/19/2011 [facility] Patient Information 2014 [place], [place].  Urinary Frequency The number of times a normal person urinates depends upon how much liquid they take in and how much liquid they are losing. If the temperature is hot and there is high humidity then the person will sweat more and usually breathe a little more frequently. These factors decrease the amount of frequency of urination that would be considered normal. The amount you drink is easily determined, but the amount of fluid lost is sometimes more difficult to calculate.  Fluid is lost in two ways:  Sensible fluid loss is usually measured by the amount of urine that you get rid of. Losses of fluid can also occur with diarrhea.  Insensible fluid loss is more difficult to measure. It is caused by evaporation. Insensible loss of fluid occurs through breathing and sweating. It usually ranges from a little less than a quart to a little more than a quart of fluid a day. In normal temperatures and activity levels the average person may urinate 4 to 7 times in a 24-hour period. Needing to urinate more often than that could indicate a problem. If one urinates 4 to 7 times in 24 hours and has large volumes each time, that could indicate a different problem from one who urinates 4 to 7 times a day and has small volumes. The time of urinating is also an important. Most urinating should be done during the waking hours. Getting up at night to urinate frequently can indicate some problems. CAUSES  The bladder is the organ in your lower abdomen that holds urine. Like a balloon, it swells some as it fills up. Your nerves sense this and tell you it is time to head  for the bathroom. There are a number of reasons that you might feel the need to urinate more often than usual. They include:  Urinary tract infection. This is usually associated with other signs such as burning when you urinate.  In men, problems with  the prostate (a walnut-size gland that is located near the tube that carries urine out of your body). There are two reasons why the prostate can cause an increased frequency of urination:  An enlarged prostate that does not let the bladder empty well. If the bladder only half empties when you urinate then it only has half the capacity to fill before you have to urinate again.  The nerves in the bladder become more hypersensitive with an increased size of the prostate even if the bladder empties completely.  Pregnancy.  Obesity. Excess weight is more likely to cause a problem for women more than for men.  Bladder stones or other bladder problems.  Caffeine.  Alcohol.  Medications. For example, drugs that help the body get rid of extra fluid (diuretics) increase urine production. Some other medicines must be taken with lots of fluids.  Muscle or nerve weakness. This might be the result of a spinal cord injury, a stroke, multiple sclerosis or Parkinson's disease.  Long-standing diabetes can decrease the sensation of the bladder. This loss of sensation makes it harder to sense the bladder needs to be emptied. Over a period of years the bladder is stretched out by constant overfilling. This weakens the bladder muscles so that the bladder does not empty well and has less capacity to fill with new urine.  Interstitial cystitis (also called painful bladder syndrome). This condition develops because the tissues that line the insider of the bladder are inflamed (inflammation is the body's way of reacting to injury or infection). It causes pain and frequent urination. It occurs in women more often than in men. DIAGNOSIS   To decide what might be causing your urinary frequency, your healthcare provider will probably:  Ask about symptoms you have noticed.  Ask about your overall health. This will include questions about any medications you are taking.  Do a physical examination.  Order some  tests. These might include:  A blood test to check for diabetes or other health issues that could be contributing to the problem.  Urine testing. This could measure the flow of urine and the pressure on the bladder.  A test of your neurological system (the brain, spinal cord and nerves). This is the system that senses the need to urinate.  A bladder test to check whether it is emptying completely when you urinate.  Cytoscopy. This test uses a thin tube with a tiny camera on it. It offers a look inside your urethra and bladder to see if there are problems.  Imaging tests. You might be given a contrast dye and then asked to urinate. X-rays are taken to see how your bladder is working. TREATMENT  It is important for you to be evaluated to determine if the amount or frequency that you have is unusual or abnormal. If it is found to be abnormal the cause should be determined and this can usually be found out easily. Depending upon the cause treatment could include medication, stimulation of the nerves, or surgery. There are not too many things that you can do as an individual to change your urinary frequency. It is important that you balance the amount of fluid intake needed to compensate for your activity and the temperature.  Medical problems will be diagnosed and taken care of by your physician. There is no particular bladder training such as Kegel's exercises that you can do to help urinary frequency. This is an exercise this is usually done for people who have leaking of urine when they laugh cough or sneeze. HOME CARE INSTRUCTIONS   Take any medications your healthcare provider prescribed or suggested. Follow the directions carefully.  Practice any lifestyle changes that are recommended. These might include:  Drinking less fluid or drinking at different times of the day. If you need to urinate often during the night, for example, you may need to stop drinking fluids early in the  evening.  Cutting down on caffeine or alcohol. They both can make you need to urinate more often than normal. Caffeine is found in coffee, tea and sodas.  Losing weight, if that is recommended.  Keep a journal or a log. You might be asked to record how much you drink and when and when you feel the need to urinate. This will also help evaluate how well the treatment provided by your physician is working. SEEK MEDICAL CARE IF:   Your need to urinate often gets worse.  You feel increased pain or irritation when you urinate.  You notice blood in your urine.  You have questions about any medications that your healthcare provider recommended.  You notice blood, pus or swelling at the site of any test or treatment procedure.  You develop a fever of more than 100.5 F (38.1 C). SEEK IMMEDIATE MEDICAL CARE IF:  You develop a fever of more than 102.0 F (38.9 C). Document Released: 04/18/2009 Document Revised: 09/14/2011 Document Reviewed: 04/18/2009 [facility] Patient Information 2014 [place], [place].  Monilial Vaginitis Vaginitis in a soreness, swelling and redness (inflammation) of the vagina and vulva. Monilial vaginitis is not a sexually transmitted infection. CAUSES  Yeast vaginitis is caused by yeast (candida) that is normally found in your vagina. With a yeast infection, the candida has overgrown in number to a point that upsets the chemical balance. SYMPTOMS   White, thick vaginal discharge.  Swelling, itching, redness and irritation of the vagina and possibly the lips of the vagina (vulva).  Burning or painful urination.  Painful intercourse. DIAGNOSIS  Things that may contribute to monilial vaginitis are:  Postmenopausal and virginal states.  Pregnancy.  Infections.  Being tired, sick or stressed, especially if you had monilial vaginitis in the past.  Diabetes. Good control will help lower the chance.  Birth control pills.  Tight fitting garments.  Using bubble  bath, feminine sprays, douches or deodorant tampons.  Taking certain medications that kill germs (antibiotics).  Sporadic recurrence can occur if you become ill. TREATMENT  Your caregiver will give you medication.  There are several kinds of anti monilial vaginal creams and suppositories specific for monilial vaginitis. For recurrent yeast infections, use a suppository or cream in the vagina 2 times a week, or as directed.  Anti-monilial or steroid cream for the itching or irritation of the vulva may also be used. Get your caregiver's permission.  Painting the vagina with methylene blue solution may help if the monilial cream does not work.  Eating yogurt may help prevent monilial vaginitis. HOME CARE INSTRUCTIONS   Finish all medication as prescribed.  Do not have sex until treatment is completed or after your caregiver tells you it is okay.  Take warm sitz baths.  Do not douche.  Do not use tampons, especially scented ones.  Wear cotton underwear.  Avoid tight pants and panty hose.  Tell your sexual partner that you have a yeast infection. They should go to their caregiver if they have symptoms such as mild rash or itching.  Your sexual partner should be treated as well if your infection is difficult to eliminate.  Practice safer sex. Use condoms.  Some vaginal medications cause latex condoms to fail. Vaginal medications that harm condoms are:  Cleocin cream.  Butoconazole (Femstat).  Terconazole (Terazol) vaginal suppository.  Miconazole (Monistat) (may be purchased over the counter). SEEK MEDICAL CARE IF:   You have a temperature by mouth above 102 F (38.9 C).  The infection is getting worse after 2 days of treatment.  The infection is not getting better after 3 days of treatment.  You develop blisters in or around your vagina.  You develop vaginal bleeding, and it is not your menstrual period.  You have pain when you urinate.  You develop  intestinal problems.  You have pain with sexual intercourse. Document Released: 04/01/2005 Document Revised: 09/14/2011 Document Reviewed: 12/14/2008 [facility] Patient Information 2014 [place], [place].

## 2013-09-19 NOTE — ED Provider Notes (Signed)
Medical screening examination/treatment/procedure(s) were performed by resident physician or non-physician practitioner and as supervising physician I was immediately available for consultation/collaboration.   Tiphany Fayson DOUGLAS MD.   Marija Calamari D Jaydyn Menon, MD 09/19/13 1116 

## 2013-09-19 NOTE — ED Notes (Signed)
Assisted with exam of patient

## 2013-09-22 LAB — CULTURE, ROUTINE-ABSCESS

## 2013-09-22 NOTE — ED Notes (Signed)
Abscess culture R thigh: Few Staph Aureus.  Pt. adequately treated with Septra DS. Vassie MoselleYork, Kalvyn Desa M 09/22/2013

## 2014-04-11 ENCOUNTER — Other Ambulatory Visit (HOSPITAL_COMMUNITY): Payer: Self-pay | Admitting: Nurse Practitioner

## 2014-04-11 DIAGNOSIS — Z1231 Encounter for screening mammogram for malignant neoplasm of breast: Secondary | ICD-10-CM

## 2014-04-18 ENCOUNTER — Ambulatory Visit (HOSPITAL_COMMUNITY): Payer: Medicare Other

## 2014-04-20 ENCOUNTER — Ambulatory Visit (HOSPITAL_COMMUNITY)
Admission: RE | Admit: 2014-04-20 | Discharge: 2014-04-20 | Disposition: A | Discharge status: Home / Self Care | Payer: Medicare Other | Source: Ambulatory Visit | Attending: Nurse Practitioner | Admitting: Nurse Practitioner

## 2014-04-20 DIAGNOSIS — Z1231 Encounter for screening mammogram for malignant neoplasm of breast: Secondary | ICD-10-CM | POA: Diagnosis present

## 2016-10-05 ENCOUNTER — Encounter (HOSPITAL_COMMUNITY): Payer: Self-pay | Admitting: Emergency Medicine

## 2016-10-05 ENCOUNTER — Ambulatory Visit (HOSPITAL_COMMUNITY)
Admission: EM | Admit: 2016-10-05 | Discharge: 2016-10-05 | Disposition: A | Discharge status: Home / Self Care | Payer: Medicare Other | Attending: Family Medicine | Admitting: Family Medicine

## 2016-10-05 DIAGNOSIS — R040 Epistaxis: Secondary | ICD-10-CM

## 2016-10-05 NOTE — ED Provider Notes (Signed)
CSN: [id number]     Arrival date & time 10/05/16  1216 History   First MD Initiated Contact with Patient 10/05/16 1323     Chief Complaint  Patient presents with  . Epistaxis   (Consider location/radiation/quality/duration/timing/severity/associated sxs/prior Treatment) 61 year old female complaining of bleeding from the right nose for the past 3 days. She has had bleeding for the past 3 consecutive days. Bleeding last for about 45 minutes before stopping. She uses pressure and wiping. Just prior to having the nosebleed she was having runny nose and having to blow her nose frequently. No history of nasal trauma.      Past Medical History:  Diagnosis Date  . Diabetes mellitus   . High cholesterol   . Hypertension   . Stroke [facility])    History reviewed. No pertinent surgical history. No family history on file. Social History  Substance Use Topics  . Smoking status: Never Smoker  . Smokeless tobacco: Not on file  . Alcohol use No   OB History    No data available     Review of Systems  Constitutional: Negative.   HENT: Positive for nosebleeds. Negative for congestion and postnasal drip.   Eyes: Negative.   Respiratory: Negative.   Gastrointestinal: Negative.   Skin: Negative.   Neurological: Negative.   All other systems reviewed and are negative.   Allergies  Patient has no known allergies.  Home Medications   Prior to Admission medications   Medication Sig Start Date End Date Taking? Authorizing Provider  ASPIRIN PO Take by mouth.    Historical Provider, MD  Aspirin-Dipyridamole (AGGRENOX PO) Take by mouth 2 (two) times daily.    Historical Provider, MD  fluconazole (DIFLUCAN) 150 MG tablet Take 1 tablet every other day for 3 doses 09/19/13   Hayden Rasmussen, NP  GlipiZIDE (GLUCOTROL PO) Take by mouth daily.    Historical Provider, MD  ibuprofen (ADVIL,MOTRIN) 600 MG tablet Take on with food three times daily until gone 12/31/11   Randa Spike, DO  IRON PO Take by  mouth.    Historical Provider, MD  LISINOPRIL-HYDROCHLOROTHIAZIDE PO Take by mouth daily.    Historical Provider, MD  nystatin ointment (MYCOSTATIN) Apply 1 application topically 2 (two) times daily. 09/19/13   Hayden Rasmussen, NP  Rosuvastatin Calcium (CRESTOR PO) Take by mouth daily.    Historical Provider, MD  SitaGLIPtin-MetFORMIN HCl (JANUMET PO) Take by mouth 2 (two) times daily.    Historical Provider, MD   Meds Ordered and Administered this Visit  Medications - No data to display  BP (!) 157/89 (BP Location: Right Arm) Comment: notified rn  Pulse 69   Temp 98.9 F (37.2 C) (Oral)   Resp 16   SpO2 98%  No data found.   Physical Exam  Constitutional: She is oriented to person, place, and time. She appears well-developed and well-nourished. No distress.  HENT:  No evidence of outer nasal trauma, no swelling or discoloration. Examination of the right nostril reveals swelling of the turbinates, clear boggy and injected. No source of bleeding can be seen. No evidence of blood within the nostril. Left nostril with minor swelling of the turbinates but no bleeding or injection. Currently no active bleeding.  Eyes: EOM are normal.  Neck: Neck supple.  Cardiovascular: Normal rate.   Pulmonary/Chest: Effort normal. No respiratory distress.  Musculoskeletal: She exhibits no edema.  Neurological: She is alert and oriented to person, place, and time. She exhibits normal muscle tone.  Skin: Skin is warm  and dry.  Psychiatric: She has a normal mood and affect.  Nursing note and vitals reviewed.   Urgent Care Course     Procedures (including critical care time)  Labs Review Labs Reviewed - No data to display  Imaging Review No results found.   Visual Acuity Review  Right Eye Distance:   Left Eye Distance:   Bilateral Distance:    Right Eye Near:   Left Eye Near:    Bilateral Near:         MDM   1. Right-sided epistaxis    Use Afrin nasal spray 1-2 sprays in the right  nostril every 12 hours for the next 2-3 days. May also use the nasal saline spray in between. Poor nosebleed that does not stopping just a few minutes seek medical attention.     Hayden Rasmussen, NP 10/05/16 [phone]

## 2016-10-05 NOTE — ED Triage Notes (Signed)
Nosebleeds started on Friday.  Another one on Saturday.  And then another nose bleed today.

## 2016-10-05 NOTE — Discharge Instructions (Signed)
Use Afrin nasal spray 1-2 sprays in the right nostril every 12 hours for the next 2-3 days. May also use the nasal saline spray in between. Poor nosebleed that does not stopping just a few minutes seek medical attention.

## 2017-01-21 ENCOUNTER — Other Ambulatory Visit: Payer: Self-pay | Admitting: Family Medicine

## 2017-01-21 DIAGNOSIS — Z1231 Encounter for screening mammogram for malignant neoplasm of breast: Secondary | ICD-10-CM

## 2017-02-02 ENCOUNTER — Ambulatory Visit
Admission: RE | Admit: 2017-02-02 | Discharge: 2017-02-02 | Disposition: A | Discharge status: Home / Self Care | Payer: Medicare Other | Source: Ambulatory Visit | Attending: Family Medicine | Admitting: Family Medicine

## 2017-02-02 DIAGNOSIS — Z1231 Encounter for screening mammogram for malignant neoplasm of breast: Secondary | ICD-10-CM

## 2018-02-22 ENCOUNTER — Other Ambulatory Visit: Payer: Self-pay

## 2018-02-22 ENCOUNTER — Emergency Department (HOSPITAL_COMMUNITY): Payer: Medicare Other

## 2018-02-22 ENCOUNTER — Encounter (HOSPITAL_COMMUNITY): Payer: Self-pay | Admitting: *Deleted

## 2018-02-22 ENCOUNTER — Emergency Department (HOSPITAL_COMMUNITY)
Admission: EM | Admit: 2018-02-22 | Discharge: 2018-02-22 | Disposition: A | Discharge status: Home / Self Care | Payer: Medicare Other | Attending: Emergency Medicine | Admitting: Emergency Medicine

## 2018-02-22 DIAGNOSIS — I10 Essential (primary) hypertension: Secondary | ICD-10-CM | POA: Insufficient documentation

## 2018-02-22 DIAGNOSIS — Z7982 Long term (current) use of aspirin: Secondary | ICD-10-CM | POA: Insufficient documentation

## 2018-02-22 DIAGNOSIS — E119 Type 2 diabetes mellitus without complications: Secondary | ICD-10-CM | POA: Diagnosis not present

## 2018-02-22 DIAGNOSIS — Z794 Long term (current) use of insulin: Secondary | ICD-10-CM | POA: Insufficient documentation

## 2018-02-22 DIAGNOSIS — Z79899 Other long term (current) drug therapy: Secondary | ICD-10-CM | POA: Diagnosis not present

## 2018-02-22 DIAGNOSIS — M545 Low back pain: Secondary | ICD-10-CM | POA: Diagnosis present

## 2018-02-22 DIAGNOSIS — M5416 Radiculopathy, lumbar region: Secondary | ICD-10-CM

## 2018-02-22 DIAGNOSIS — M5441 Lumbago with sciatica, right side: Secondary | ICD-10-CM

## 2018-02-22 LAB — URINALYSIS, ROUTINE W REFLEX MICROSCOPIC
BILIRUBIN URINE: NEGATIVE
Hgb urine dipstick: NEGATIVE
KETONES UR: 5 mg/dL — AB
Nitrite: NEGATIVE
PH: 5 (ref 5.0–8.0)
Protein, ur: NEGATIVE mg/dL
SPECIFIC GRAVITY, URINE: 1.027 (ref 1.005–1.030)

## 2018-02-22 MED ORDER — TRAMADOL HCL 50 MG PO TABS: 50.0000 mg | ORAL_TABLET | Freq: Four times a day (QID) | ORAL | 0 refills | Status: AC | PRN

## 2018-02-22 MED ORDER — LORAZEPAM 0.5 MG PO TABS
0.5000 mg | ORAL_TABLET | Freq: Once | ORAL | Status: AC | PRN
  Administered 2018-02-22: 0.5 mg via ORAL
  Filled 2018-02-22: qty 1

## 2018-02-22 MED ORDER — TRAMADOL HCL 50 MG PO TABS: 50.0000 mg | ORAL_TABLET | Freq: Four times a day (QID) | ORAL | 0 refills | Status: DC | PRN

## 2018-02-22 MED ORDER — LORAZEPAM 2 MG/ML IJ SOLN: 0.5000 mg | Freq: Once | INTRAMUSCULAR | Status: DC | PRN

## 2018-02-22 MED ORDER — OXYCODONE-ACETAMINOPHEN 5-325 MG PO TABS
1.0000 | ORAL_TABLET | Freq: Once | ORAL | Status: AC
  Administered 2018-02-22: 1 via ORAL
  Filled 2018-02-22: qty 1

## 2018-02-22 NOTE — ED Provider Notes (Signed)
[facility] EMERGENCY DEPARTMENT Provider Note   CSN: [id number] Arrival date & time: 02/22/18  1327     History   Chief Complaint Chief Complaint  Patient presents with  . Back Pain    HPI [name] is a 62 y.o. female with a history of a CVA, DM type II, HTN, and hypercholesteremia who presents to the emergency department with a chief complaint of back pain.  The patient endorses gradual onset, gradually worsening midline low back pain that began approximately 1 week ago.  No known trauma or injury.  She reports that she is since a senior citizens workout class, which she has been doing for a long period of time, and states that is very low impact and she actually will sit down during some of the exercises.  No new exercises this week.  She reports that even when she goes to the grocery store that she will get assistance with carrying items out to her car and have her grandchildren help her carry items into the house.  No new lifting, activities at home, or other exacerbating factors.  She reports that the pain is characterized as tingling or burning and has progressively worsened from her right low back down the right buttock, thigh, and has now reached her right foot and toes.  She denies shooting pain.  She reports associated weakness in the right leg over the last week that she characterizes as heaviness.  She reports that over the last few days she has had to lift her pajamas when she is laying in bed to help her get out of bed in the morning because it is too weak for her to move on her own.  She also reports that she feels as if she has been dragging her leg when she walks with her cane at home since her pain began.  She also reports that the pain intermittently wraps around her right flank and she endorses dysuria.  No hematuria, frequency, vaginal pain or discharge, or abdominal pain.  She reports that she has woken up several times over the last week drenched  in sweat, but denies fever or chills.  No other focal weakness, numbness, headache, visual changes, slurred speech, ataxia, dizziness, lightheadedness.  She reports that she was seen at her PCPs office yesterday and they performed an x-ray of her low back.  She was advised to take Tylenol arthritis, which she has been taking at home with no relief.  She reports they also repeated her blood work and A1c yesterday at the office.  The history is provided by the patient. No language interpreter was used.    Past Medical History:  Diagnosis Date  . Diabetes mellitus   . High cholesterol   . Hypertension   . Stroke [facility](HCC)     Patient Active Problem List   Diagnosis Date Noted  . LEG CRAMPS, NOCTURNAL 01/10/2010  . OBESITY 04/25/2009  . TRICHOMONIASIS 09/14/2008  . GLAUCOMA 09/10/2008  . ANEMIA 07/24/2008  . HYPERCHOLESTEROLEMIA 05/24/2008  . HYPERTENSION, BENIGN ESSENTIAL 05/24/2008  . 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 DIABETES MELLITUS 07/06/2000    History reviewed. No pertinent surgical history.   OB History   None      Home Medications    Prior to Admission medications   Medication Sig Start Date End Date Taking? Authorizing Provider  ASPIRIN PO Take 81 mg by mouth daily at 12 noon.    Yes [provider]  atorvastatin (LIPITOR) 40 MG tablet Take 40 mg by mouth daily. 12/24/17  Yes [provider]  Canagliflozin-metFORMIN HCl 50-1000 MG TABS Take 1 tablet by mouth 2 (two) times daily. 12/23/17  Yes [provider]  ferrous sulfate 325 (65 FE) MG tablet Take 325 mg by mouth daily with breakfast.   Yes [provider]  ibuprofen (ADVIL,MOTRIN) 600 MG tablet Take on with food three times daily until gone Patient taking differently: Take 400 mg by mouth every 4 (four) hours as needed for moderate pain. Take on with food three times daily until gone 12/31/11  Yes Lykins, Claretha Cooper, DO  Insulin  Glargine-Lixisenatide 100-33 UNT-MCG/ML SOPN Inject 26 Units into the skin daily at 12 noon. 12/23/17  Yes [provider]  lisinopril-hydrochlorothiazide (PRINZIDE,ZESTORETIC) 20-25 MG tablet Take 1 tablet by mouth daily.    Yes [provider]  naproxen sodium (ALEVE) 220 MG tablet Take 220 mg by mouth daily as needed (pain).   Yes [provider]  fluconazole (DIFLUCAN) 150 MG tablet Take 1 tablet every other day for 3 doses Patient not taking: Reported on 02/22/2018 09/19/13   Hayden Rasmussen, NP  nystatin ointment (MYCOSTATIN) Apply 1 application topically 2 (two) times daily. Patient not taking: Reported on 02/22/2018 09/19/13   Hayden Rasmussen, NP    Family History Family History  Problem Relation Age of Onset  . Breast cancer Sister     Social History Social History   Tobacco Use  . Smoking status: Never Smoker  Substance Use Topics  . Alcohol use: No  . Drug use: No     Allergies   Patient has no known allergies.   Review of Systems Review of Systems  Constitutional: Positive for diaphoresis. Negative for activity change, chills and fever.  Eyes: Negative for visual disturbance.  Respiratory: Negative for shortness of breath.   Cardiovascular: Negative for chest pain.  Gastrointestinal: Negative for abdominal pain, diarrhea, nausea and vomiting.  Genitourinary: Positive for dysuria and frequency. Negative for hematuria, vaginal bleeding and vaginal discharge.  Musculoskeletal: Positive for arthralgias, back pain, gait problem and myalgias. Negative for joint swelling, neck pain and neck stiffness.  Skin: Negative for rash.  Allergic/Immunologic: Positive for immunocompromised state.  Neurological: Positive for weakness. Negative for dizziness, numbness and headaches.  Psychiatric/Behavioral: Negative for confusion.     Physical Exam Updated Vital Signs BP (!) 150/82 (BP Location: Right Arm)   Pulse 76   Temp 98 F (36.7 C) (Oral)   Resp 20    SpO2 100%   Physical Exam  Constitutional: No distress.  HENT:  Head: Normocephalic.  Eyes: Conjunctivae are normal.  Neck: Neck supple.  Cardiovascular: Normal rate and regular rhythm. Exam reveals no gallop and no friction rub.  No murmur heard. Pulmonary/Chest: Effort normal. No stridor. No respiratory distress. She has no wheezes. She has no rales. She exhibits no tenderness.  Abdominal: Soft. Bowel sounds are normal. She exhibits no distension and no mass. There is no tenderness. There is no rebound and no guarding. No hernia.  Abdomen is soft, nontender, nondistended.  She has right CVA tenderness.  No left CVA tenderness.  Musculoskeletal: She exhibits tenderness. She exhibits no edema or deformity.  No tenderness to the cervical or thoracic lumbar spinous processes or bilateral paraspinal muscles.  Focal tenderness palpation to the lumbar spinous processes and right paraspinal muscles.  No left-sided paraspinal muscle tenderness.  No crepitus or step-offs.  No overlying erythema, edema, or warmth.  No rashes.  DP and PT pulses are 2+ and symmetric.  Sensation is intact and equal throughout the bilateral lower extremities.  5 out of 5 strength against resistance with dorsiflexion plantarflexion.  With ambulation, the patient is noted to be dragging the right leg with her gait.  Normal step and weightbearing with the left leg.  Neurological: She is alert.  Skin: Skin is warm. No rash noted.  Psychiatric: Her behavior is normal.  Nursing note and vitals reviewed.    ED Treatments / Results  Labs (all labs ordered are listed, but only abnormal results are displayed) Labs Reviewed  URINALYSIS, ROUTINE W REFLEX MICROSCOPIC    EKG None  Radiology No results found.  Procedures Procedures (including critical care time)  Medications Ordered in ED Medications  LORazepam (ATIVAN) tablet 0.5 mg (has no administration in time range)  oxyCODONE-acetaminophen (PERCOCET/ROXICET)  5-325 MG per tablet 1 tablet (1 tablet Oral Given 02/22/18 1731)     Initial Impression / Assessment and Plan / ED Course  I have reviewed the triage vital signs and the nursing notes.  Pertinent labs & imaging results that were available during my care of the patient were reviewed by me and considered in my medical decision making (see chart for details).     62 year old female with a history of a CVA, DM type II, HTN, and hypercholesteremia resenting with atraumatic right-sided low back pain with right leg weakness for the last week.  She also endorses dysuria and urinary frequency.  Labs from yesterday's PCP visit reviewed.  Doubt CVA as the patient is having no other neurologic symptoms.  X-ray with anterior subluxation of L4 on L5 measuring 11 mm.  Given acute neurologic deficits indicating of atraumatic pain, just the patient with Dr. Freida BusmanAllen, attending physician.  MRI lumbar spine ordered.  UA is also pending.  Percocet given for pain control.  Ativan ordered prior to MRI. Patient care transferred to PA Hedges at the end of my shift. Patient presentation, ED course, and plan of care discussed with review of all pertinent labs and imaging. Please see his/her note for further details regarding further ED course and disposition.   Final Clinical Impressions(s) / ED Diagnoses   Final diagnoses:  None    ED Discharge Orders    None       Barkley BoardsMcDonald, Geraldy Akridge A, PA-C 02/22/18 1735    Lorre NickAllen, Anthony, MD 02/24/18 1108

## 2018-02-22 NOTE — ED Notes (Signed)
Patient transported to MRI 

## 2018-02-22 NOTE — ED Triage Notes (Signed)
Pt in c/o lower back pain that radiates down her right leg, went to PCP for same and had an xray indicating chronic arthritis, pt instructed to take tylenol arthritis and was prescribed muscle relaxers but was unable to fill them due to cost

## 2018-02-22 NOTE — Discharge Instructions (Addendum)
Please read attached information. If you experience any new or worsening signs or symptoms please return to the emergency room for evaluation. Please follow-up with your primary care provider or specialist as discussed. Please use medication prescribed only as directed and discontinue taking if you have any concerning signs or symptoms.   °

## 2018-02-22 NOTE — ED Provider Notes (Signed)
  62 year old female signed out to me at shift change pending MRI.  Please see previous providers note for full H&P.  Patient with worsening mid low back pain.  Patient does have a past medical history of stroke with right-sided deficits and uses a cane for walking.  She notes that she has had more difficulty walking secondary to the low back pain and reported weakness in the right lower extremity.  No traumatic events.  Patient notes pain comes and goes, she has had additional numbness on the proximal right thigh.  Upon my evaluation patient is resting comfortably in exam bed, she does endorse pain in the right leg and numbness over the proximal thigh.  She is able to ambulate with her cane without significant difficulties.  Patient additionally notes that occasionally she has urinary frequency and burning which she attributes to her blood sugar being high.  She denies any present urinary complaints.  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 There is also severe bilateral foraminal narrowing at this level, worse on the right. Advanced facet degenerative change results in 1 cm anterolisthesis.  Mild right foraminal narrowing L3-4.   Assessment and plan: 62 year old female presents today with leg pain and reported weakness.  She does have decreased strength compared to the left side but has deficits secondary to stroke throughout her entire right side.  Patient does appear to be ambulating without significant difficulties at her baseline.  She does have findings on MRI as noted above, no media surgical evaluation indicated at this time.  I will have patient follow-up as an outpatient with neurosurgery for evaluation management, she will return immediately with any new or worsening signs or symptoms.  After discussion patient will use Ultram as needed for pain, she is counseled on the risks and benefits.  Patient verbalized understanding and agreement to today's  plan.    Results for orders placed or performed during the hospital encounter of 02/22/18 (from the past 24 hour(s))  Urinalysis, Routine w reflex microscopic     Status: Abnormal   Collection Time: 02/22/18  8:55 PM  Result Value Ref Range   Color, Urine YELLOW YELLOW   APPearance CLEAR CLEAR   Specific Gravity, Urine 1.027 1.005 - 1.030   pH 5.0 5.0 - 8.0   Glucose, UA >=500 (A) NEGATIVE mg/dL   Hgb urine dipstick NEGATIVE NEGATIVE   Bilirubin Urine NEGATIVE NEGATIVE   Ketones, ur 5 (A) NEGATIVE mg/dL   Protein, ur NEGATIVE NEGATIVE mg/dL   Nitrite NEGATIVE NEGATIVE   Leukocytes, UA TRACE (A) NEGATIVE   RBC / HPF 0-5 0 - 5 RBC/hpf   WBC, UA 0-5 0 - 5 WBC/hpf   Bacteria, UA RARE (A) NONE SEEN   Squamous Epithelial / LPF 0-5 0 - 5   Mucus PRESENT      Eyvonne MechanicHedges, Elanore Talcott, PA-C 02/22/18 2140    Virgina Norfolkuratolo, Adam, DO 02/22/18 2158

## 2018-03-13 ENCOUNTER — Other Ambulatory Visit: Payer: Self-pay

## 2018-03-13 ENCOUNTER — Encounter (HOSPITAL_COMMUNITY): Payer: Self-pay | Admitting: Emergency Medicine

## 2018-03-13 ENCOUNTER — Emergency Department (HOSPITAL_COMMUNITY)
Admission: EM | Admit: 2018-03-13 | Discharge: 2018-03-13 | Disposition: A | Discharge status: Home / Self Care | Payer: Medicare Other | Attending: Emergency Medicine | Admitting: Emergency Medicine

## 2018-03-13 DIAGNOSIS — I10 Essential (primary) hypertension: Secondary | ICD-10-CM | POA: Diagnosis not present

## 2018-03-13 DIAGNOSIS — Z8673 Personal history of transient ischemic attack (TIA), and cerebral infarction without residual deficits: Secondary | ICD-10-CM | POA: Diagnosis not present

## 2018-03-13 DIAGNOSIS — Z794 Long term (current) use of insulin: Secondary | ICD-10-CM | POA: Diagnosis not present

## 2018-03-13 DIAGNOSIS — E119 Type 2 diabetes mellitus without complications: Secondary | ICD-10-CM | POA: Insufficient documentation

## 2018-03-13 DIAGNOSIS — Z7982 Long term (current) use of aspirin: Secondary | ICD-10-CM | POA: Diagnosis not present

## 2018-03-13 DIAGNOSIS — M79604 Pain in right leg: Secondary | ICD-10-CM

## 2018-03-13 DIAGNOSIS — Z79899 Other long term (current) drug therapy: Secondary | ICD-10-CM | POA: Insufficient documentation

## 2018-03-13 MED ORDER — ONDANSETRON HCL 4 MG PO TABS: 4.0000 mg | ORAL_TABLET | Freq: Four times a day (QID) | ORAL | 0 refills | Status: AC

## 2018-03-13 MED ORDER — OXYCODONE-ACETAMINOPHEN 5-325 MG PO TABS
2.0000 | ORAL_TABLET | Freq: Once | ORAL | Status: AC
  Administered 2018-03-13: 2 via ORAL
  Filled 2018-03-13: qty 2

## 2018-03-13 MED ORDER — OXYCODONE-ACETAMINOPHEN 5-325 MG PO TABS: 1.0000 | ORAL_TABLET | ORAL | 0 refills | Status: AC | PRN

## 2018-03-13 MED ORDER — ONDANSETRON 4 MG PO TBDP
4.0000 mg | ORAL_TABLET | Freq: Once | ORAL | Status: AC
Start: 2018-03-13 — End: 2018-03-13
  Administered 2018-03-13: 4 mg via ORAL
  Filled 2018-03-13: qty 1

## 2018-03-13 NOTE — ED Provider Notes (Addendum)
[facility] EMERGENCY DEPARTMENT Provider Note  CSN: [id number] Arrival date & time: 03/13/18  1444  History   Chief Complaint Chief Complaint  Patient presents with  . Leg Pain    HPI [name] is a 62 y.o. female with a medical history of stroke, HTN, Type 2 DM and stroke who presented to the ED for right leg pain x1 month. She describes constant sharp, burning and aching pain in the anterior and posterior part of her right leg from the hip to the foot. Pain has not acutely worsened since last evaluation on 02/22/18.  Rates pain as 10/10. Pain is worse with movement and palpation of the posterior aspect. Relief achieved with Tylenol, but only for 2-4 hours. At baseline, patient ambulates with a cane because of residual right side weakness following a stroke, but states that it has been painful to walk even with assistance. Denies fever, chills, back pain, paresthesias, coordination/balance issues, urinary or bowel incontinence or saddle anesthesia. Denies leg swelling, color or temperature change in her legs, claudication s/s. She denies any recent traumas, falls or injuries. Patient has tried heating pads, Tylenol, Meloxicam and Tramadol for relief.    Additional history obtained by medical chart. Patient seen in the ED on 02/22/18 for similar complaint. 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 Patient seen by ortho on 02/25/18 where she was given steroid taper x7 days and instructed to take Meloxciam 7.5mg  which has not helped. Next appointment for 04/2018.  Past Medical History:  Diagnosis Date  . Diabetes mellitus   . High cholesterol   . Hypertension   . Stroke [facility])     Patient Active Problem List   Diagnosis Date Noted  . LEG CRAMPS, NOCTURNAL 01/10/2010  . OBESITY 04/25/2009  . TRICHOMONIASIS 09/14/2008  . GLAUCOMA 09/10/2008  . ANEMIA 07/24/2008  . HYPERCHOLESTEROLEMIA 05/24/2008  .  HYPERTENSION, BENIGN ESSENTIAL 05/24/2008  . 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 DIABETES MELLITUS 07/06/2000    History reviewed. No pertinent surgical history.   OB History   None      Home Medications    Prior to Admission medications   Medication Sig Start Date End Date Taking? Authorizing Provider  ASPIRIN PO Take 81 mg by mouth daily at 12 noon.     [provider]  atorvastatin (LIPITOR) 40 MG tablet Take 40 mg by mouth daily. 12/24/17   [provider]  Canagliflozin-metFORMIN HCl 50-1000 MG TABS Take 1 tablet by mouth 2 (two) times daily. 12/23/17   [provider]  ferrous sulfate 325 (65 FE) MG tablet Take 325 mg by mouth daily with breakfast.    [provider]  fluconazole (DIFLUCAN) 150 MG tablet Take 1 tablet every other day for 3 doses Patient not taking: Reported on 02/22/2018 09/19/13   Hayden Rasmussen, NP  ibuprofen (ADVIL,MOTRIN) 600 MG tablet Take on with food three times daily until gone Patient taking differently: Take 400 mg by mouth every 4 (four) hours as needed for moderate pain. Take on with food three times daily until gone 12/31/11   Lykins, Claretha Cooper, DO  Insulin Glargine-Lixisenatide 100-33 UNT-MCG/ML SOPN Inject 26 Units into the skin daily at 12 noon. 12/23/17   [provider]  lisinopril-hydrochlorothiazide (PRINZIDE,ZESTORETIC) 20-25 MG tablet Take 1 tablet by mouth daily.     [provider]  naproxen sodium (ALEVE) 220 MG tablet Take 220 mg by mouth  daily as needed (pain).    [provider]  nystatin ointment (MYCOSTATIN) Apply 1 application topically 2 (two) times daily. Patient not taking: Reported on 02/22/2018 09/19/13   Hayden Rasmussen, NP  ondansetron (ZOFRAN) 4 MG tablet Take 1 tablet (4 mg total) by mouth every 6 (six) hours. 03/13/18   Woodson Macha, Jerrel Ivory I, PA-C  oxyCODONE-acetaminophen (PERCOCET/ROXICET) 5-325 MG tablet Take 1-2 tablets by mouth every 4  (four) hours as needed for up to 3 days for severe pain. 03/13/18 03/16/18  Esbeidy Mclaine, Jerrel Ivory I, PA-C  traMADol (ULTRAM) 50 MG tablet Take 1 tablet (50 mg total) by mouth every 6 (six) hours as needed. 02/22/18   Eyvonne Mechanic, PA-C    Family History Family History  Problem Relation Age of Onset  . Breast cancer Sister     Social History Social History   Tobacco Use  . Smoking status: Never Smoker  . Smokeless tobacco: Never Used  Substance Use Topics  . Alcohol use: No  . Drug use: No     Allergies   Patient has no known allergies.   Review of Systems Review of Systems   Physical Exam Updated Vital Signs BP (!) 176/94   Pulse 60   Temp 97.9 F (36.6 C) (Oral)   Resp 18   SpO2 98%   Physical Exam  Constitutional: She appears well-developed and well-nourished.  Grimacing in pain with movement  Cardiovascular:  Pulses:      Dorsalis pedis pulses are 2+ on the right side, and 2+ on the left side.       Posterior tibial pulses are 2+ on the right side, and 2+ on the left side.  Musculoskeletal:       Right hip: She exhibits tenderness. She exhibits normal range of motion, normal strength, no bony tenderness and no deformity.       Right knee: She exhibits normal range of motion. No tenderness found.       Right ankle: Normal.       Lumbar back: She exhibits normal range of motion, no tenderness, no pain and no spasm.       Right upper leg: She exhibits tenderness. She exhibits no bony tenderness.       Right lower leg: She exhibits no tenderness and no bony tenderness.       Right foot: Normal.  Positive straight leg raise on the right. Full ROM of lower extremities bilaterally. 4/5 strength in right lower extremity. 5/5 strength in left lower extremity.  No lumbar spine tenderness, decreased ROM or spasm.  Neurological: She has normal strength. She displays no atrophy. No sensory deficit. She exhibits normal muscle tone.  Reflex Scores:      Patellar reflexes are  2+ on the right side and 2+ on the left side.      Achilles reflexes are 2+ on the right side and 2+ on the left side. Skin: Skin is warm and intact. Capillary refill takes less than 2 seconds. No abrasion and no bruising noted.  Nursing note and vitals reviewed.  ED Treatments / Results  Labs (all labs ordered are listed, but only abnormal results are displayed) Labs Reviewed - No data to display  EKG None  Radiology No results found.  Procedures Procedures (including critical care time)  Medications Ordered in ED Medications  oxyCODONE-acetaminophen (PERCOCET/ROXICET) 5-325 MG per tablet 2 tablet (2 tablets Oral Given 03/13/18 1705)  ondansetron (ZOFRAN-ODT) disintegrating tablet 4 mg (4 mg Oral Given 03/13/18 1705)  Initial Impression / Assessment and Plan / ED Course  Triage vital signs and the nursing notes have been reviewed.  Pertinent labs & imaging results that were available during care of the patient were reviewed and considered in medical decision making (see chart for details).    Patient presents well appearing and in no acute distress, but grimaces in pain when moved. She is able to ambulate on her own with a cane (which is her baseline), but continues to complain of right leg pain which has not acutely worsened since her last evaluation of it on 02/22/18 (in the ED) or 02/25/18 (by ortho). There have been no new or severe traumas, falls or injuries that would warrant imaging today. There are no risk factors or complaints of claudication, swelling or accompanying cardiac or pulmonary complaints that would raise concern for an acute vascular occlusion or DVT.  There is no midline tenderness, deformities or abnormal neuro findings on exam or in history to suggest an acute spinal cord or osseus pathology that warrant imaging today. No systemic s/s to suggest underlying infectious or rheumatologic etiology.  Physical exam and history overall is reassuring that there is not  an acute or emergent cause to right leg pain. Given MRI results, patient has significant foraminal narrowing which is contributing to these symptoms. Likely that her advanced disease is beyond conservative therapy and may require different interventions for pain relief. Patient already established with ortho for this issue and patient recommended to follow-up with them soon. With the exception of muscular tenderness, her MSK and neuro exam is normal.  Final Clinical Impressions(s) / ED Diagnoses  1. Right Leg Pain 2/2 Spinal Narrowing from L3-L5. Rx for Percocet x3 days prescribed for pain. No current Rx in [place] Controlled Database. Patient gets minimal relief with Tylenol and NSAIDs. Advised to follow-up with ortho soon to discuss other interventions for relief. Given back exercises to do for additional relief.  Dispo: Home. After thorough clinical evaluation, this patient is determined to be medically stable and can be safely discharged with the previously mentioned treatment and/or outpatient follow-up/referral(s). At this time, there are no other apparent medical conditions that require further screening, evaluation or treatment.   Final diagnoses:  Right leg pain    ED Discharge Orders         Ordered    oxyCODONE-acetaminophen (PERCOCET/ROXICET) 5-325 MG tablet  Every 4 hours PRN     03/13/18 1841    ondansetron (ZOFRAN) 4 MG tablet  Every 6 hours     03/13/18 1841            Windy Carina, PA-C 03/13/18 1842    Rhia Blatchford, Sharyon Medicus, PA-C 03/13/18 1844    Eber Hong, MD 03/14/18 [phone]

## 2018-03-13 NOTE — Discharge Instructions (Addendum)
Your MRI results from 02/22/18 show severe narrowing at L4-L5 (in your low back) which is the reason for your symptoms. Based on our talks, I have no concerns that there is something different causing your pain today.  I recommend that you call Dr. Jon Gills office tomorrow to schedule an appointment ASAP to discuss other interventions for your pain. I have written you a short course of pain medication to help.  Thank you for allowing me to take care of you today! I wish you a speedy recovery.

## 2018-03-13 NOTE — ED Triage Notes (Signed)
C/o R leg pain x 1 month.  Seen in ED for same on 8/20 and had MRI.  Taking Meloxicam and Tylenol.  Also seen by PCP for same.  Reports pain has increased.  Denies back pain.

## 2018-03-13 NOTE — ED Notes (Signed)
Pt given heat pack per request.

## 2018-04-07 ENCOUNTER — Other Ambulatory Visit: Payer: Self-pay | Admitting: Family Medicine

## 2018-04-07 DIAGNOSIS — Z1231 Encounter for screening mammogram for malignant neoplasm of breast: Secondary | ICD-10-CM

## 2018-05-16 ENCOUNTER — Ambulatory Visit
Admission: RE | Admit: 2018-05-16 | Discharge: 2018-05-16 | Disposition: A | Discharge status: Home / Self Care | Payer: Medicare Other | Source: Ambulatory Visit | Attending: Family Medicine | Admitting: Family Medicine

## 2018-05-16 DIAGNOSIS — Z1231 Encounter for screening mammogram for malignant neoplasm of breast: Secondary | ICD-10-CM

## 2019-04-26 ENCOUNTER — Other Ambulatory Visit: Payer: Self-pay | Admitting: Family Medicine

## 2019-04-26 DIAGNOSIS — Z1231 Encounter for screening mammogram for malignant neoplasm of breast: Secondary | ICD-10-CM

## 2019-06-19 ENCOUNTER — Ambulatory Visit
Admission: RE | Admit: 2019-06-19 | Discharge: 2019-06-19 | Disposition: A | Discharge status: Home / Self Care | Payer: Medicare Other | Source: Ambulatory Visit | Attending: Family Medicine | Admitting: Family Medicine

## 2019-06-19 ENCOUNTER — Other Ambulatory Visit: Payer: Self-pay

## 2019-06-19 DIAGNOSIS — Z1231 Encounter for screening mammogram for malignant neoplasm of breast: Secondary | ICD-10-CM

## 2020-08-13 ENCOUNTER — Other Ambulatory Visit: Payer: Self-pay | Admitting: Family Medicine

## 2020-08-13 DIAGNOSIS — Z0000 Encounter for general adult medical examination without abnormal findings: Secondary | ICD-10-CM

## 2020-10-01 ENCOUNTER — Ambulatory Visit
Admission: RE | Admit: 2020-10-01 | Discharge: 2020-10-01 | Disposition: A | Discharge status: Home / Self Care | Payer: Medicare Other | Source: Ambulatory Visit | Attending: Family Medicine | Admitting: Family Medicine

## 2020-10-01 ENCOUNTER — Other Ambulatory Visit: Payer: Self-pay

## 2020-10-01 DIAGNOSIS — Z0000 Encounter for general adult medical examination without abnormal findings: Secondary | ICD-10-CM

## 2021-11-06 ENCOUNTER — Other Ambulatory Visit: Payer: Self-pay | Admitting: Family Medicine

## 2021-11-06 DIAGNOSIS — Z1231 Encounter for screening mammogram for malignant neoplasm of breast: Secondary | ICD-10-CM

## 2021-11-13 ENCOUNTER — Ambulatory Visit
Admission: RE | Admit: 2021-11-13 | Discharge: 2021-11-13 | Disposition: A | Discharge status: Home / Self Care | Payer: Medicare Other | Source: Ambulatory Visit | Attending: Family Medicine | Admitting: Family Medicine

## 2021-11-13 DIAGNOSIS — Z1231 Encounter for screening mammogram for malignant neoplasm of breast: Secondary | ICD-10-CM

## 2023-01-18 IMAGING — MG MM DIGITAL SCREENING BILAT W/ TOMO AND CAD
8 series · 8 of 24 positions shown · non-contrast
Comparison: Previous exam(s).

CLINICAL DATA: Screening.

EXAM:
DIGITAL SCREENING BILATERAL MAMMOGRAM WITH TOMOSYNTHESIS AND CAD
TECHNIQUE: Bilateral screening digital craniocaudal and mediolateral oblique
mammograms were obtained. Bilateral screening digital breast
tomosynthesis was performed. The images were evaluated with
computer-aided detection.

[R MLO synth-2D]
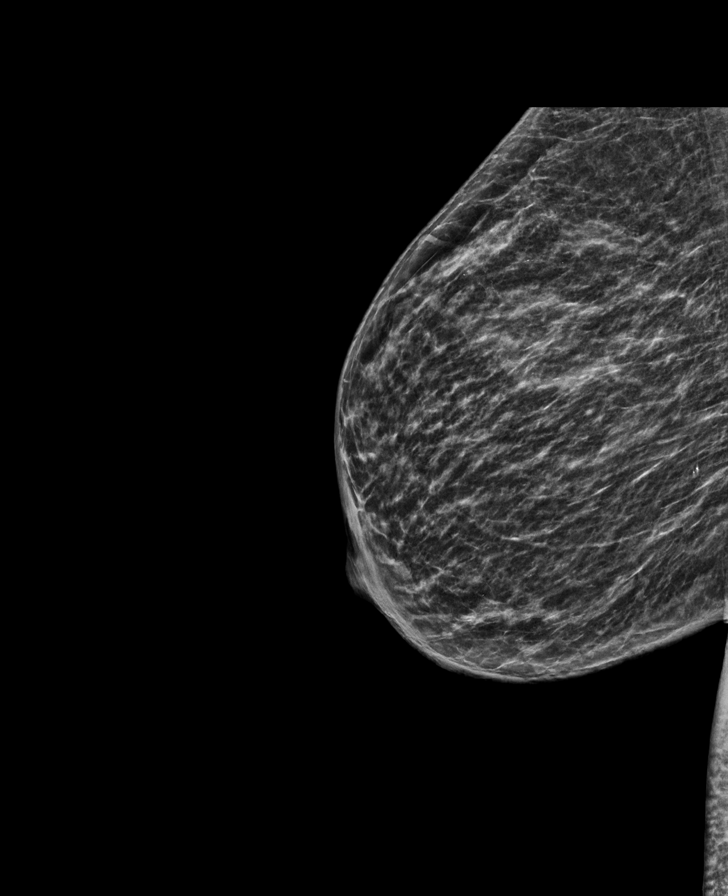

[L CC synth-2D]
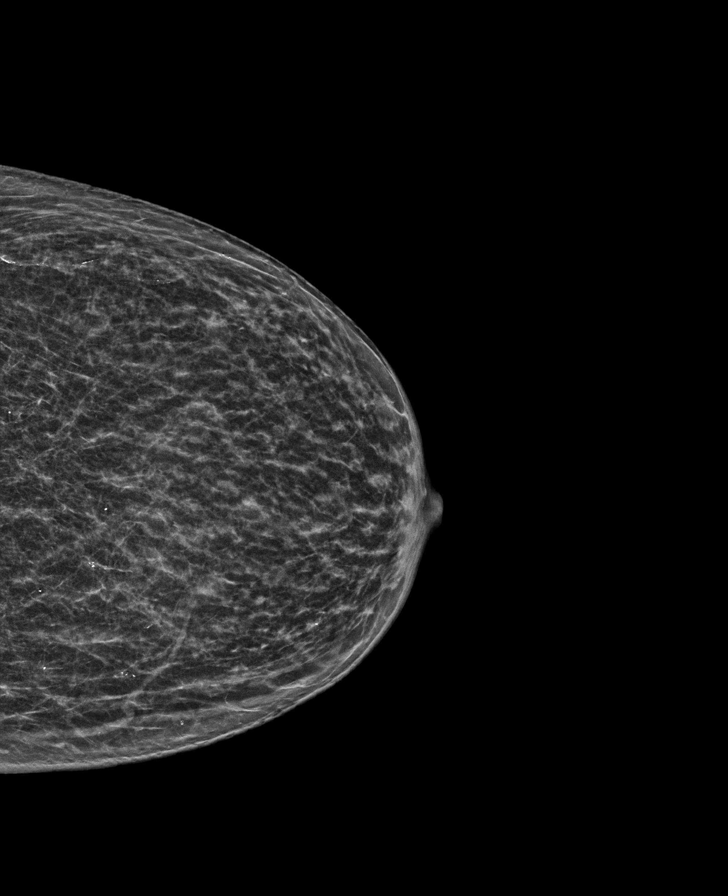

[R CC synth-2D]
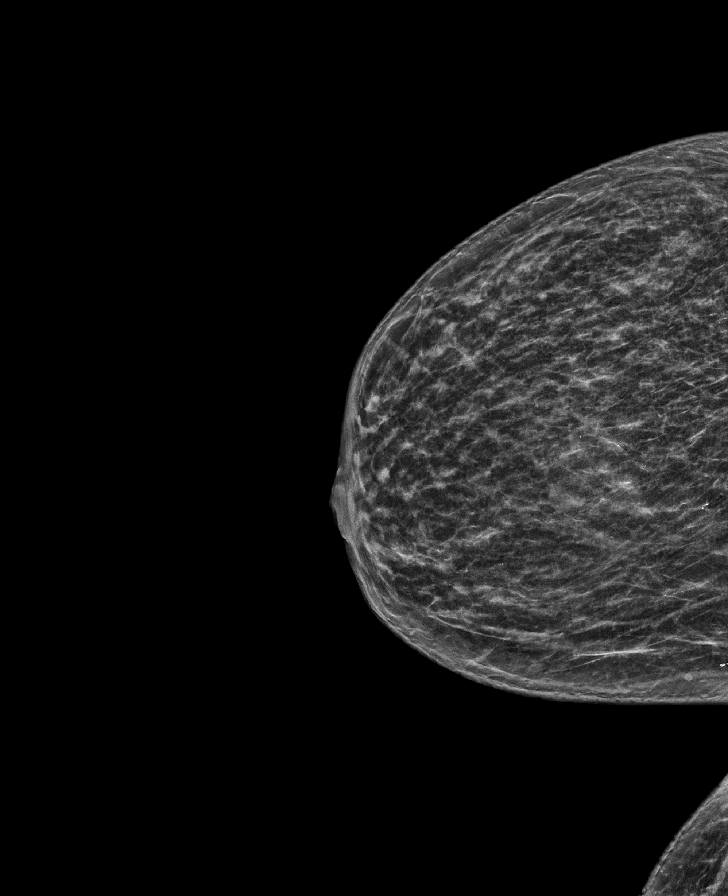

[L MLO synth-2D]
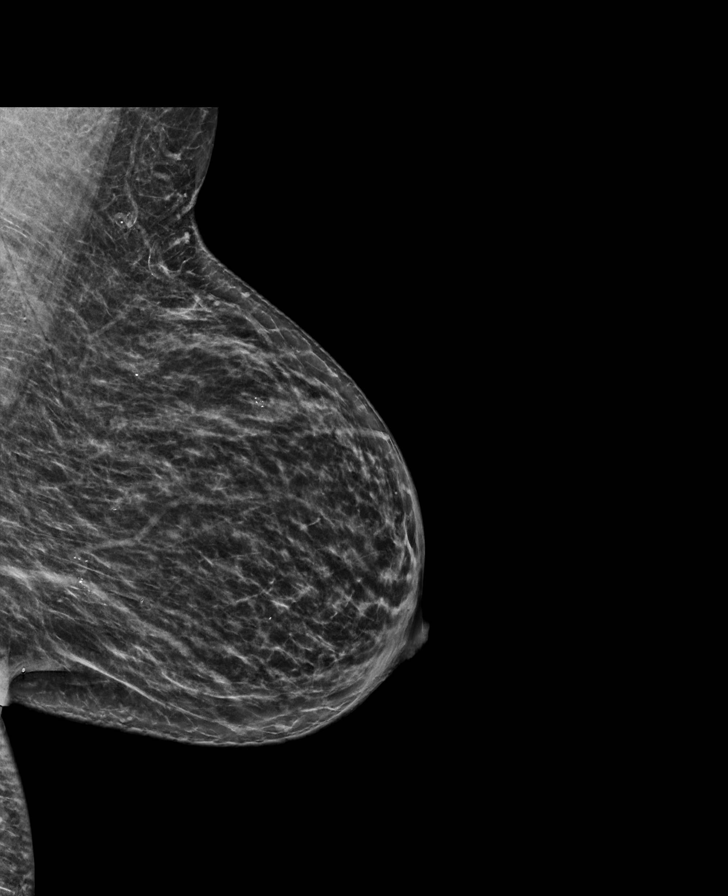

[R MLO tomo · tomo slice 28/55.0]
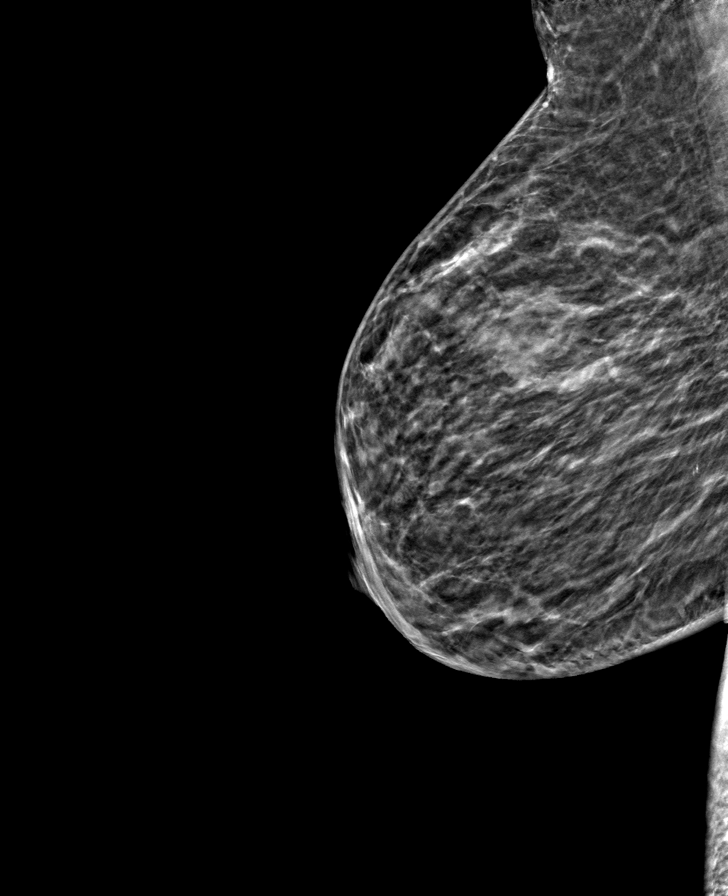

[L MLO tomo · tomo slice 30/59.0]
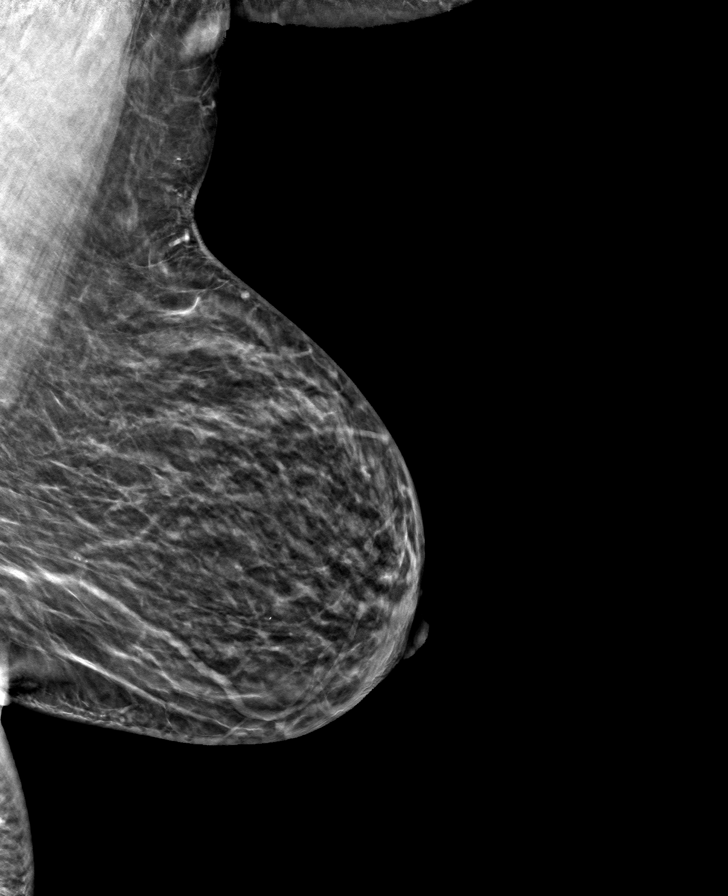

[L CC tomo · tomo slice 25/50.0]
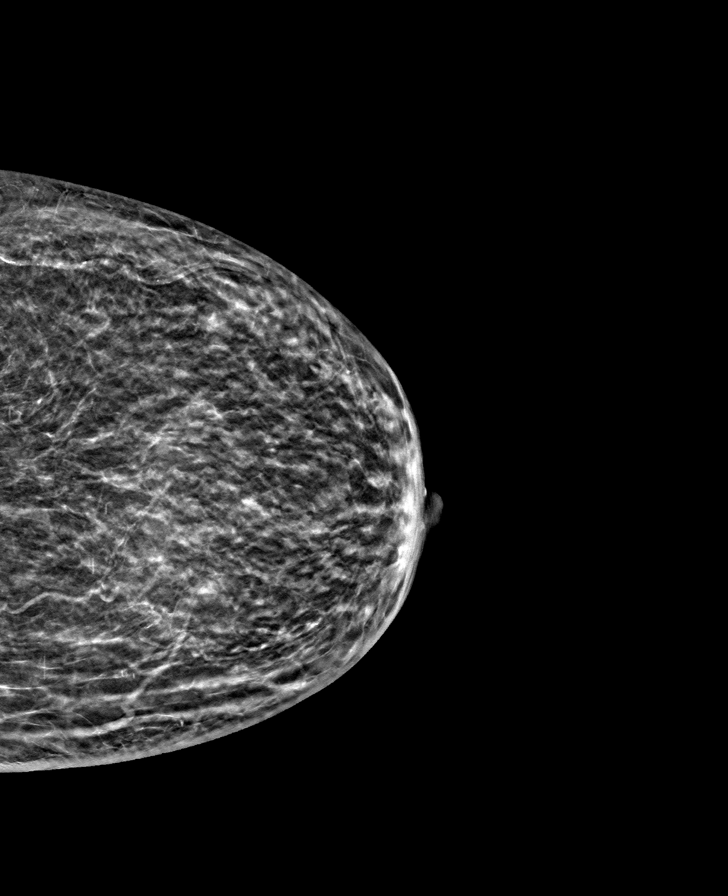

[R CC tomo · tomo slice 27/52.0]
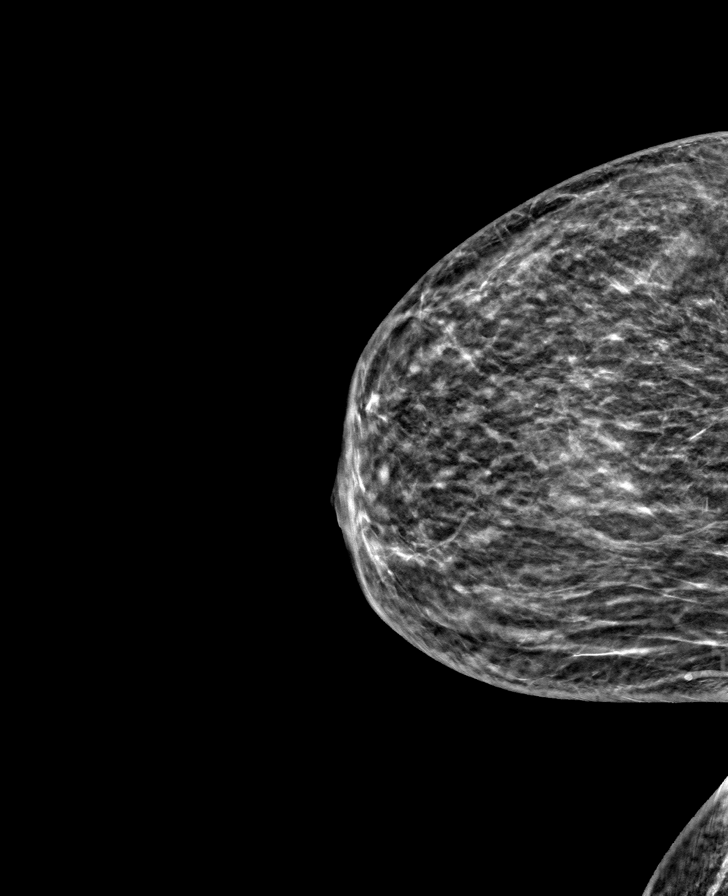

[8 of 24 positions shown; findings below may reference images not displayed]

ACR Breast Density Category c: The breast tissue is heterogeneously
dense, which may obscure small masses.
FINDINGS: There are no findings suspicious for malignancy.
IMPRESSION: No mammographic evidence of malignancy. A result letter of this
screening mammogram will be mailed directly to the patient.

RECOMMENDATION:
Screening mammogram in one year. (Code:Q3-W-BC3)

BI-RADS CATEGORY  1: Negative.
